# Patient Record
Sex: Male | Born: 1970 | Race: Black or African American | Hispanic: No | Marital: Married | Smoking: Current every day smoker
Health system: Southern US, Community
[De-identification: ages and names within clinical notes are randomized; demographics above are authoritative.]

## PROBLEM LIST (undated history)

## (undated) DIAGNOSIS — G1221 Amyotrophic lateral sclerosis: Secondary | ICD-10-CM

## (undated) HISTORY — PX: OTHER SURGICAL HISTORY: SHX169

---

## 2013-03-18 ENCOUNTER — Encounter (HOSPITAL_COMMUNITY): Payer: Self-pay

## 2013-03-18 ENCOUNTER — Emergency Department (HOSPITAL_COMMUNITY): Payer: Self-pay

## 2013-03-18 ENCOUNTER — Emergency Department (HOSPITAL_COMMUNITY)
Admission: EM | Admit: 2013-03-18 | Discharge: 2013-03-18 | Disposition: A | Discharge status: Home / Self Care | Payer: Self-pay | Attending: Emergency Medicine | Admitting: Emergency Medicine

## 2013-03-18 DIAGNOSIS — F17200 Nicotine dependence, unspecified, uncomplicated: Secondary | ICD-10-CM | POA: Insufficient documentation

## 2013-03-18 DIAGNOSIS — N451 Epididymitis: Secondary | ICD-10-CM

## 2013-03-18 DIAGNOSIS — N489 Disorder of penis, unspecified: Secondary | ICD-10-CM | POA: Insufficient documentation

## 2013-03-18 DIAGNOSIS — R369 Urethral discharge, unspecified: Secondary | ICD-10-CM | POA: Insufficient documentation

## 2013-03-18 DIAGNOSIS — N5089 Other specified disorders of the male genital organs: Secondary | ICD-10-CM | POA: Insufficient documentation

## 2013-03-18 DIAGNOSIS — R300 Dysuria: Secondary | ICD-10-CM | POA: Insufficient documentation

## 2013-03-18 DIAGNOSIS — N453 Epididymo-orchitis: Secondary | ICD-10-CM | POA: Insufficient documentation

## 2013-03-18 LAB — CBC WITH DIFFERENTIAL/PLATELET
Basophils Relative: 0 % (ref 0–1)
HCT: 35.1 % — ABNORMAL LOW (ref 39.0–52.0)
Hemoglobin: 12 g/dL — ABNORMAL LOW (ref 13.0–17.0)
Lymphocytes Relative: 13 % (ref 12–46)
Lymphs Abs: 2.1 10*3/uL (ref 0.7–4.0)
Monocytes Absolute: 1.3 10*3/uL — ABNORMAL HIGH (ref 0.1–1.0)
Monocytes Relative: 8 % (ref 3–12)
Neutro Abs: 12.8 10*3/uL — ABNORMAL HIGH (ref 1.7–7.7)
Neutrophils Relative %: 79 % — ABNORMAL HIGH (ref 43–77)
RBC: 3.56 MIL/uL — ABNORMAL LOW (ref 4.22–5.81)
WBC: 16.3 10*3/uL — ABNORMAL HIGH (ref 4.0–10.5)

## 2013-03-18 LAB — URINALYSIS, ROUTINE W REFLEX MICROSCOPIC
Glucose, UA: NEGATIVE mg/dL
Nitrite: NEGATIVE
Specific Gravity, Urine: >1.03 — ABNORMAL HIGH (ref 1.005–1.030)
pH: 5.5 (ref 5.0–8.0)

## 2013-03-18 LAB — LACTIC ACID, PLASMA: Lactic Acid, Venous: 1.2 mmol/L (ref 0.5–2.2)

## 2013-03-18 LAB — BASIC METABOLIC PANEL
BUN: 8 mg/dL (ref 6–23)
CO2: 29 mEq/L (ref 19–32)
Chloride: 98 mEq/L (ref 96–112)
GFR calc Af Amer: 80 mL/min — ABNORMAL LOW (ref >90)
Glucose, Bld: 120 mg/dL — ABNORMAL HIGH (ref 70–99)
Potassium: 3.9 mEq/L (ref 3.5–5.1)

## 2013-03-18 MED ORDER — DEXTROSE 5 % IV SOLN
1.0000 g | Freq: Once | INTRAVENOUS | Status: AC
  Administered 2013-03-18: 1 g via INTRAVENOUS
  Filled 2013-03-18: qty 10

## 2013-03-18 MED ORDER — AZITHROMYCIN 250 MG PO TABS
500.0000 mg | ORAL_TABLET | Freq: Once | ORAL | Status: AC
  Administered 2013-03-18: 500 mg via ORAL
  Filled 2013-03-18: qty 2

## 2013-03-18 MED ORDER — DOXYCYCLINE HYCLATE 100 MG PO CAPS: 100.0000 mg | ORAL_CAPSULE | Freq: Two times a day (BID) | ORAL | Status: DC

## 2013-03-18 MED ORDER — ACETAMINOPHEN 500 MG PO TABS
1000.0000 mg | ORAL_TABLET | Freq: Once | ORAL | Status: AC
  Administered 2013-03-18: 1000 mg via ORAL
  Filled 2013-03-18: qty 2

## 2013-03-18 NOTE — ED Provider Notes (Signed)
CSN: [id number]     Arrival date & time 03/18/13  1751 History  This chart was scribed for Larry Sou, MD by Bennett Scrape, ED Scribe. This patient was seen in room APA09/APA09 and the patient's care was started at 6:28 PM.   First MD Initiated Contact with Patient 03/18/13 1817     Chief Complaint  Patient presents with  . Abdominal Pain    Patient is a 42 y.o. male presenting with abdominal pain. The history is provided by the patient. No language interpreter was used.  Abdominal Pain This is a new problem. The current episode started more than 2 days ago. The problem occurs constantly. The problem has been gradually worsening. Associated symptoms include abdominal pain. Nothing aggravates the symptoms. Nothing relieves the symptoms. He has tried nothing for the symptoms.    HPI Comments: Larry Hunt is a 42 y.o. male who presents to the Emergency Department complaining of 3 days of suprapubic abdominal pain with associated penile pain, penile discharge and dysuria. Also complains of swelling of left testicle for approximately 3 days. No known fever.  No nausea or vomiting. No other associated symptoms. He denies having any known fevers. Last BM was earlier today and he denies having pain with BMs. He states that he has been eating and drinking normally since the onset. Pt does not have a h/o chronic medical conditions. Pt is a current everyday smoker but denies alcohol use. Last used marijuana 2 to 3 months ago. No treatment prior to coming here.Marland Kitchen   PMFH: None  History  Substance Use Topics  . Smoking status: Current Every Day Smoker  . Smokeless tobacco: Not on file  . Alcohol Use: No    Review of Systems  Constitutional: Negative.   HENT: Negative.   Respiratory: Negative.   Cardiovascular: Negative.   Gastrointestinal: Positive for abdominal pain. Negative for nausea and vomiting.  Genitourinary: Positive for dysuria, discharge, scrotal swelling and penile  pain.  Musculoskeletal: Negative.   Skin: Negative.   Neurological: Negative.   Psychiatric/Behavioral: Negative.   All other systems reviewed and are negative.    Allergies  Review of patient's allergies indicates no known allergies.  Home Medications  No current outpatient prescriptions on file.  Triage Vitals: BP 126/59  Pulse 89  Temp(Src) 102.1 F (38.9 C) (Oral)  Resp 20  Ht 6\' 1"  (1.854 m)  Wt 195 lb (88.451 kg)  BMI 25.73 kg/m2  SpO2 99%  Physical Exam  Nursing note and vitals reviewed. Constitutional: He appears well-developed and well-nourished.  HENT:  Head: Normocephalic and atraumatic.  Eyes: Conjunctivae are normal. Pupils are equal, round, and reactive to light.  Neck: Neck supple. No tracheal deviation present. No thyromegaly present.  Cardiovascular: Normal rate and regular rhythm.   No murmur heard. Pulmonary/Chest: Effort normal and breath sounds normal.  Abdominal: Soft. Bowel sounds are normal. He exhibits no distension. There is no tenderness.  Genitourinary: Penis normal.  Left scrotum is diffusely swollen,  and tender. Circumcised. No obvious urethral discharge  Musculoskeletal: Normal range of motion. He exhibits no edema and no tenderness.  Neurological: He is alert. Coordination normal.  Skin: Skin is warm and dry. No rash noted.  Psychiatric: He has a normal mood and affect.    ED Course   Procedures (including critical care time)  Medications  cefTRIAXone (ROCEPHIN) 1 g in dextrose 5 % 50 mL IVPB (1 g Intravenous New Bag/Given 03/18/13 2046)  acetaminophen (TYLENOL) tablet 1,000 mg (1,000 mg Oral  Given 03/18/13 1852)  azithromycin [facility]) tablet 500 mg (500 mg Oral Given 03/18/13 2053)    DIAGNOSTIC STUDIES: Oxygen Saturation is 99% on room air, normal by my interpretation.    COORDINATION OF CARE: 6:35 PM-Discussed treatment plan which includes medications, [place], CBC panel, BMP and UA with pt at bedside and pt agreed to plan.  8:46  PM-Pt rechecked and reports improvement with medications listed above.  Labs Reviewed  CBC WITH DIFFERENTIAL - Abnormal; Notable for the following:    WBC 16.3 (*)    RBC 3.56 (*)    Hemoglobin 12.0 (*)    HCT 35.1 (*)    Neutrophils Relative % 79 (*)    Neutro Abs 12.8 (*)    Monocytes Absolute 1.3 (*)    All other components within normal limits  BASIC METABOLIC PANEL - Abnormal; Notable for the following:    Glucose, Bld 120 (*)    GFR calc non Af Amer 69 (*)    GFR calc Af Amer 80 (*)    All other components within normal limits  URINALYSIS, ROUTINE W REFLEX MICROSCOPIC - Abnormal; Notable for the following:    Color, Urine AMBER (*)    APPearance TURBID (*)    Specific Gravity, Urine >1.030 (*)    Hgb urine dipstick MODERATE (*)    Bilirubin Urine SMALL (*)    Ketones, ur TRACE (*)    Protein, ur 30 (*)    Leukocytes, UA MODERATE (*)    All other components within normal limits  URINE MICROSCOPIC-ADD ON - Abnormal; Notable for the following:    Bacteria, UA MANY (*)    All other components within normal limits  GC/CHLAMYDIA PROBE AMP  URINE CULTURE  LACTIC ACID, PLASMA  RPR   US Scrotum  03/18/2013   *RADIOLOGY REPORT*  Clinical Data:  The patient febrile with a tender, swollen left scrotum  SCROTAL ULTRASOUND DOPPLER ULTRASOUND OF THE TESTICLES  Technique: Complete ultrasound examination of the testicles, epididymis, and other scrotal structures was performed.  Color and spectral Doppler ultrasound were also utilized to evaluate blood flow to the testicles.  Comparison:  Findings:  Right testis:  Normal in size and echogenicity with no masses.  The right testicle measures 4.4 cm x 2.6 cm x 3.5 cm.  Left testis:  Normal in size and echogenicity with no masses.  The left testicle measures 4.5 cm x 3.1 cm x 2.9 cm.  Right epididymis:  Normal in size and appearance.  Left epididymis:  Heterogeneous in echogenicity, but normal in overall size with relatively symmetric blood flow  when compared to the right epididymis.  Hydrocele:  Bilateral hydroceles, larger on the left.  Varicocele:  No varicoceles  Pulsed Doppler interrogation of both testes demonstrates low resistance flow bilaterally. There is no significant asymmetry of blood flow.  There is scrotal wall thickening that is most evident on the left.  IMPRESSION: No significant epididymal or testicular hyperemia to suggest epididymitis/orchitis.  There are bilateral hydroceles, both relatively large, but the left being the largest.  There is associated scrotal wall thickening also appeared greater on the left.  No testicular mass or torsion.   Original Report Authenticated By: Amie Portland, M.D.   No diagnosis found. On further discussion with Dr.Ormond, radiologist he reported to me verbally on the telephone that in doing images he felt the left  At the epididymitis was likely hyperemic, possibly consistent with epididymitis. He suggested putting patient epididymitis it is felt that he has a  clinically.  Patient feels much improved after treatment with antibiotics and Tylenol. He declined other pain medication Results for orders placed during the hospital encounter of 03/18/13  CBC WITH DIFFERENTIAL      Result Value Range   WBC 16.3 (*) 4.0 - 10.5 K/uL   RBC 3.56 (*) 4.22 - 5.81 MIL/uL   Hemoglobin 12.0 (*) 13.0 - 17.0 g/dL   HCT 45.4 (*) 09.8 - 11.9 %   MCV 98.6  78.0 - 100.0 fL   MCH 33.7  26.0 - 34.0 pg   MCHC 34.2  30.0 - 36.0 g/dL   RDW 14.7  82.9 - 56.2 %   Platelets 178  150 - 400 K/uL   Neutrophils Relative % 79 (*) 43 - 77 %   Neutro Abs 12.8 (*) 1.7 - 7.7 K/uL   Lymphocytes Relative 13  12 - 46 %   Lymphs Abs 2.1  0.7 - 4.0 K/uL   Monocytes Relative 8  3 - 12 %   Monocytes Absolute 1.3 (*) 0.1 - 1.0 K/uL   Eosinophils Relative 0  0 - 5 %   Eosinophils Absolute 0.1  0.0 - 0.7 K/uL   Basophils Relative 0  0 - 1 %   Basophils Absolute 0.0  0.0 - 0.1 K/uL  BASIC METABOLIC PANEL      Result Value Range    Sodium 135  135 - 145 mEq/L   Potassium 3.9  3.5 - 5.1 mEq/L   Chloride 98  96 - 112 mEq/L   CO2 29  19 - 32 mEq/L   Glucose, Bld 120 (*) 70 - 99 mg/dL   BUN 8  6 - 23 mg/dL   Creatinine, Ser 1.30  0.50 - 1.35 mg/dL   Calcium 9.4  8.4 - 86.5 mg/dL   GFR calc non Af Amer 69 (*) >90 mL/min   GFR calc Af Amer 80 (*) >90 mL/min  URINALYSIS, ROUTINE W REFLEX MICROSCOPIC      Result Value Range   Color, Urine AMBER (*) YELLOW   APPearance TURBID (*) CLEAR   Specific Gravity, Urine >1.030 (*) 1.005 - 1.030   pH 5.5  5.0 - 8.0   Glucose, UA NEGATIVE  NEGATIVE mg/dL   Hgb urine dipstick MODERATE (*) NEGATIVE   Bilirubin Urine SMALL (*) NEGATIVE   Ketones, ur TRACE (*) NEGATIVE mg/dL   Protein, ur 30 (*) NEGATIVE mg/dL   Urobilinogen, UA 1.0  0.0 - 1.0 mg/dL   Nitrite NEGATIVE  NEGATIVE   Leukocytes, UA MODERATE (*) NEGATIVE  URINE MICROSCOPIC-ADD ON      Result Value Range   WBC, UA TOO NUMEROUS TO COUNT  <3 WBC/hpf   RBC / HPF 3-6  <3 RBC/hpf   Bacteria, UA MANY (*) RARE  LACTIC ACID, PLASMA      Result Value Range   Lactic Acid, Venous 1.2  0.5 - 2.2 mmol/L   US Scrotum  03/18/2013   **ADDENDUM** CREATED: 03/18/2013 20:57:26  I have discussed this case with the emergency room physician and re- reviewed the images.  Although there is no significant asymmetry of flow to the testicles or epididymi, the heterogeneity of the left epididymal head and the larger left hydrocele would support a left epididymitis given the patient's clinical presentation.  Also, although ordered as a scrotal ultrasound, Doppler imaging of the arterial and venous reticular flow was performed as part of this exam.  **END ADDENDUM** SIGNED BY: Amie Portland, M.D.  03/18/2013   *RADIOLOGY  REPORT*  Clinical Data:  The patient febrile with a tender, swollen left scrotum  SCROTAL ULTRASOUND DOPPLER ULTRASOUND OF THE TESTICLES  Technique: Complete ultrasound examination of the testicles, epididymis, and other scrotal  structures was performed.  Color and spectral Doppler ultrasound were also utilized to evaluate blood flow to the testicles.  Comparison:  Findings:  Right testis:  Normal in size and echogenicity with no masses.  The right testicle measures 4.4 cm x 2.6 cm x 3.5 cm.  Left testis:  Normal in size and echogenicity with no masses.  The left testicle measures 4.5 cm x 3.1 cm x 2.9 cm.  Right epididymis:  Normal in size and appearance.  Left epididymis:  Heterogeneous in echogenicity, but normal in overall size with relatively symmetric blood flow when compared to the right epididymis.  Hydrocele:  Bilateral hydroceles, larger on the left.  Varicocele:  No varicoceles  Pulsed Doppler interrogation of both testes demonstrates low resistance flow bilaterally. There is no significant asymmetry of blood flow.  There is scrotal wall thickening that is most evident on the left.  IMPRESSION: No significant epididymal or testicular hyperemia to suggest epididymitis/orchitis.  There are bilateral hydroceles, both relatively large, but the left being the largest.  There is associated scrotal wall thickening also appeared greater on the left.  No testicular mass or torsion.   Original Report Authenticated By: Amie Portland, M.D.    MDM  Clinically patient has epididymal orchitis Plan prescription doxycycline. Urology referral. Tylenol for fever Diagnosis left epididymoorchitis      Larry Sou, MD 03/18/13 2203

## 2013-03-18 NOTE — ED Notes (Signed)
Pt c/o abdominal pain and penile discharge with painful urination that started about 3 days ago.

## 2013-03-18 NOTE — ED Notes (Signed)
Pt co swollen scrotum, penile discharge, fever and abdominal pain. [place] called in, ETA 45 minutes.

## 2013-03-18 NOTE — ED Notes (Signed)
Patient given discharge instruction, verbalized understand. . IV removed, by tech, band aid applied. Patient ambulatory out of the department.

## 2013-03-18 NOTE — ED Notes (Signed)
MD at bedside. 

## 2013-03-19 LAB — URINE CULTURE
Colony Count: NO GROWTH
Culture: NO GROWTH

## 2013-03-19 LAB — RPR: RPR Ser Ql: NONREACTIVE

## 2013-03-19 LAB — GC/CHLAMYDIA PROBE AMP: GC Probe RNA: POSITIVE — AB

## 2013-03-20 NOTE — ED Notes (Addendum)
+   gonorrhea + chlamydia Patient treated with Rocephin And Zithromax-[facility] faxed

## 2013-03-21 ENCOUNTER — Telehealth (HOSPITAL_COMMUNITY): Payer: Self-pay | Admitting: Emergency Medicine

## 2013-03-21 NOTE — ED Notes (Signed)
Patient has +Gonorrhea & +Chlamydia.

## 2013-03-21 NOTE — ED Notes (Signed)
+  Gonorrhea. +Chlamydia. Patient treated with Rocephin and Zithromax. [facility] faxed. 

## 2013-03-24 ENCOUNTER — Telehealth (HOSPITAL_COMMUNITY): Payer: Self-pay | Admitting: Emergency Medicine

## 2014-02-05 IMAGING — US US SCROTUM
1 series · 13 of 25 positions shown · non-contrast
Comparison: 

***ADDENDUM*** CREATED: 03/18/2013 [DATE]

I have discussed this case with the emergency room physician and re-
reviewed the images.  Although there is no significant asymmetry of
flow to the testicles or epididymi, the heterogeneity of the left
epididymal head and the larger left hydrocele would support a left
epididymitis given the patient's clinical presentation.
Also, although ordered as a scrotal ultrasound, Doppler imaging of
the arterial and venous reticular flow was performed as part of
this exam.
***END ADDENDUM*** SIGNED BY: Aliar Teka, M.D.
CLINICAL DATA: The patient febrile with a tender, swollen left
scrotum
SCROTAL ULTRASOUND
DOPPLER ULTRASOUND OF THE TESTICLES
TECHNIQUE: Complete ultrasound examination of the testicles,
epididymis, and other scrotal structures was performed.  Color and
spectral Doppler ultrasound were also utilized to evaluate blood
flow to the testicles.

[Series 1: us scrotum · 0.06mm/px · 13 of 79 slices shown]
[im 1/79]
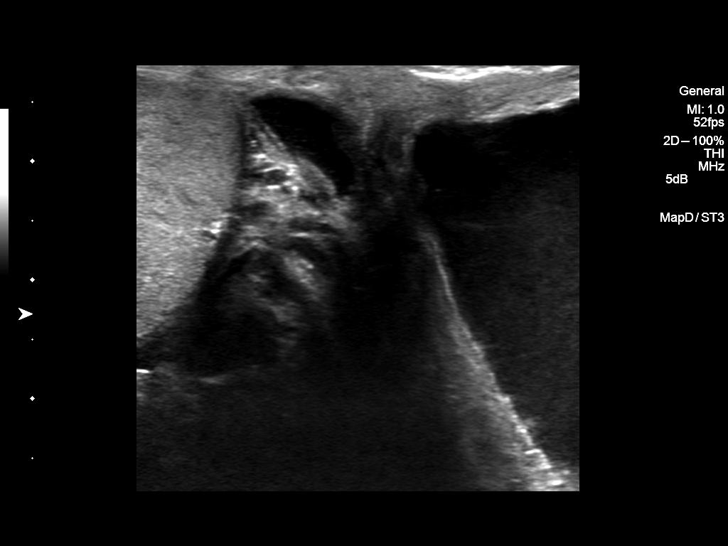
[im 7/79]
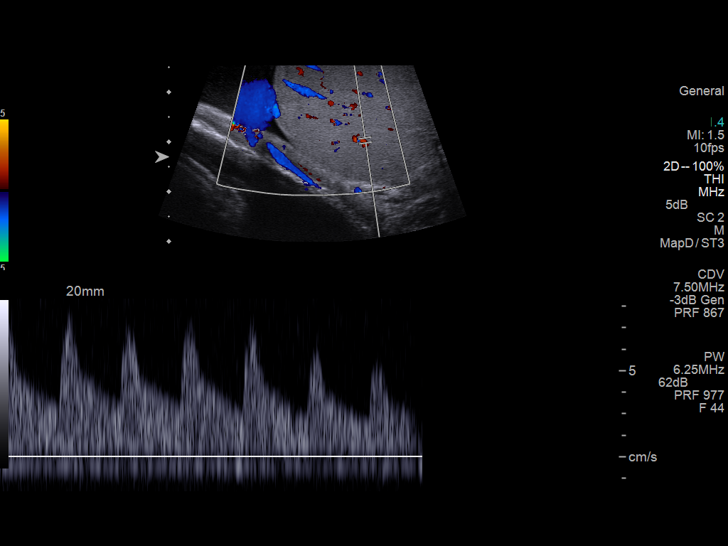
[im 14/79]
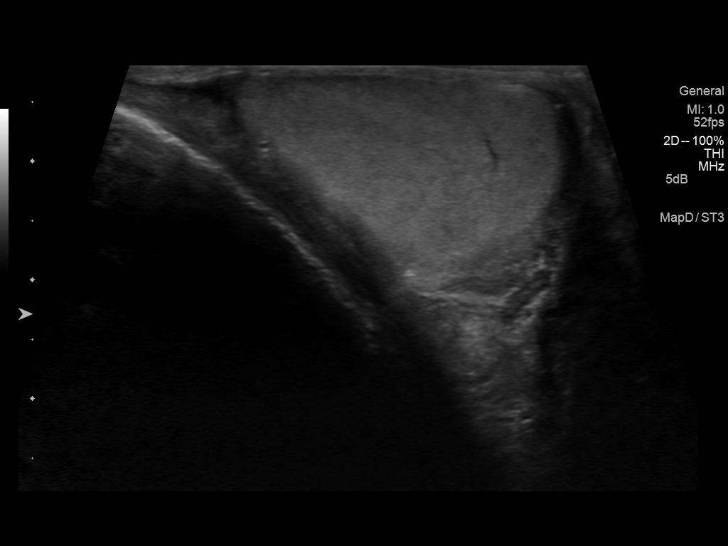
[im 20/79]
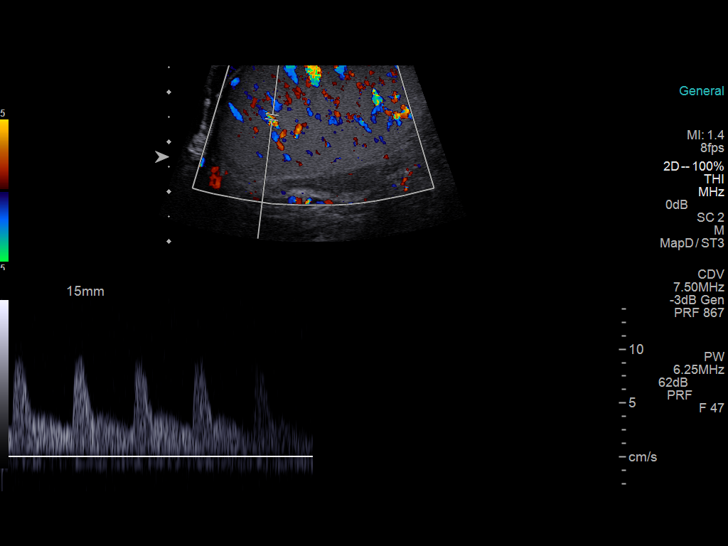
[im 27/79]
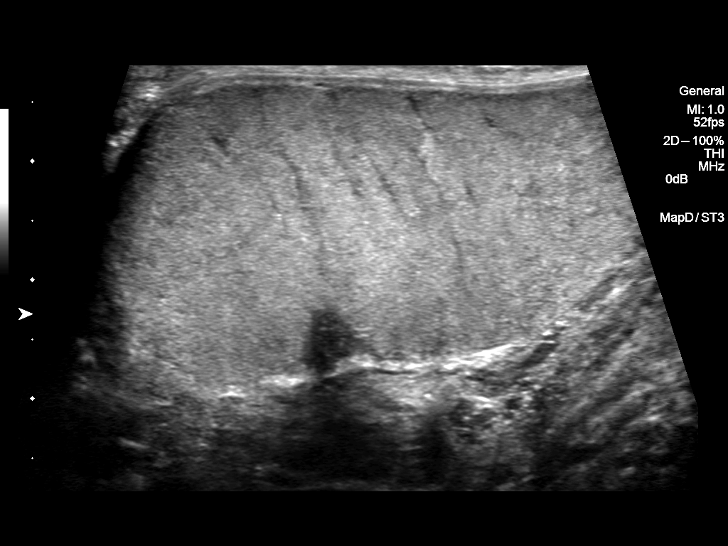
[im 33/79]
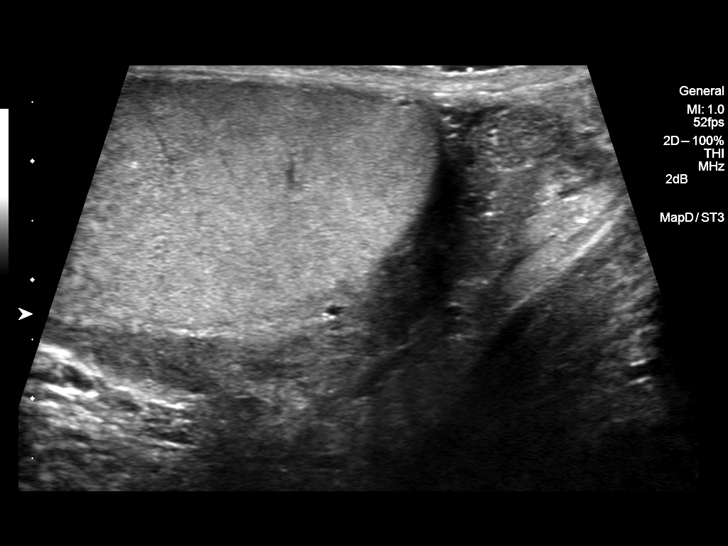
[im 40/79]
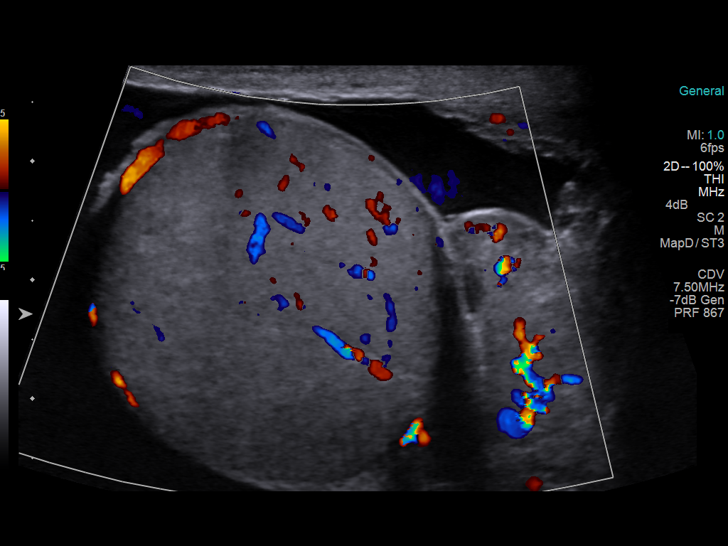
[im 46/79]
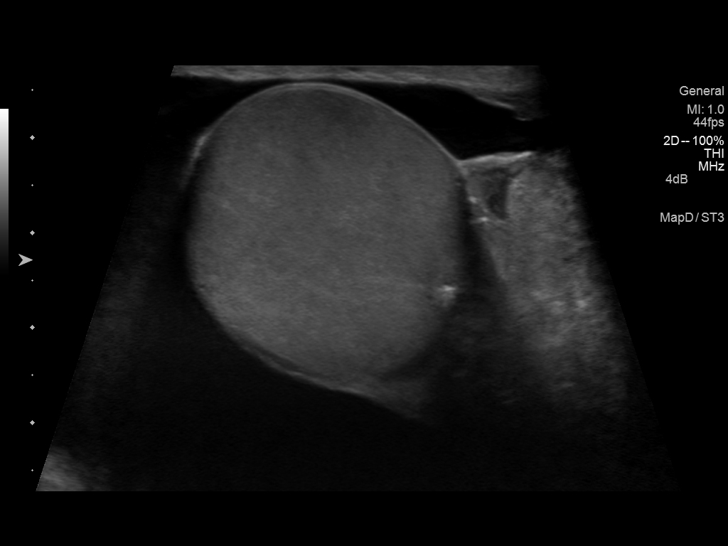
[im 53/79]
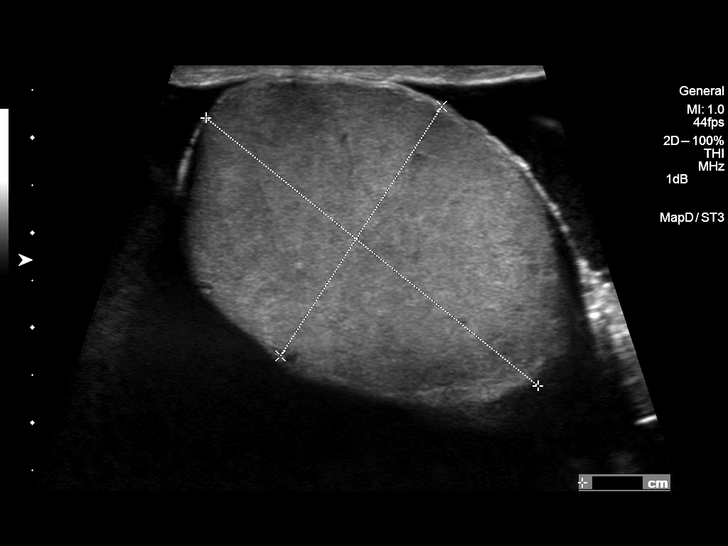
[im 59/79]
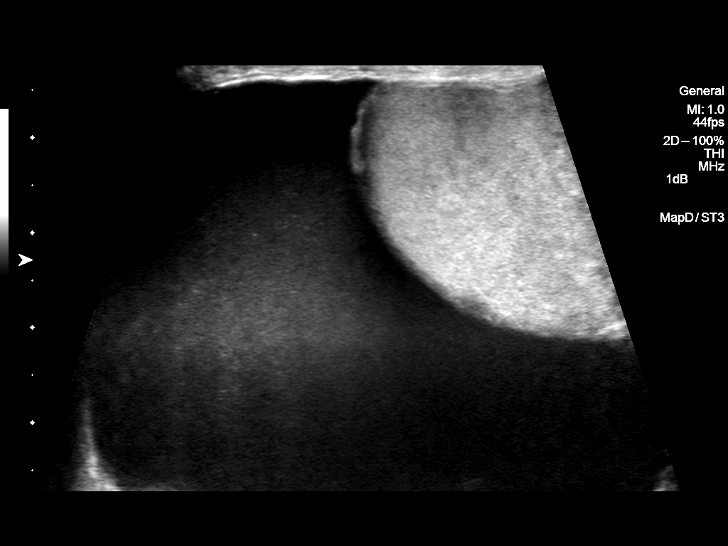
[im 66/79]
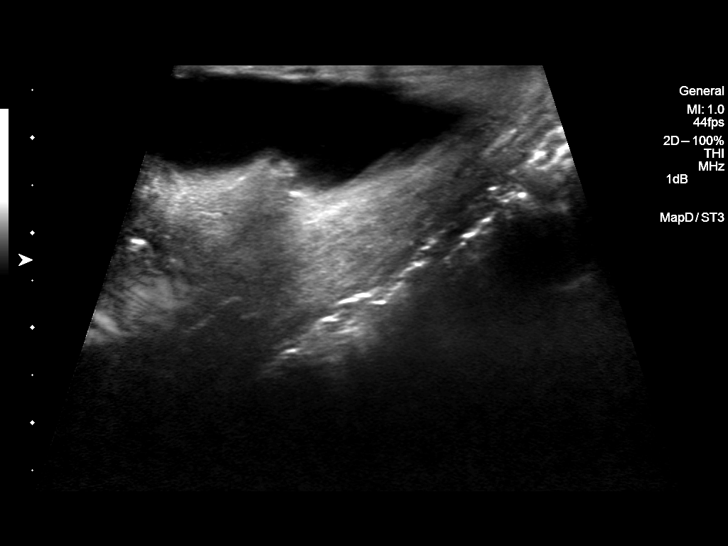
[im 72/79]
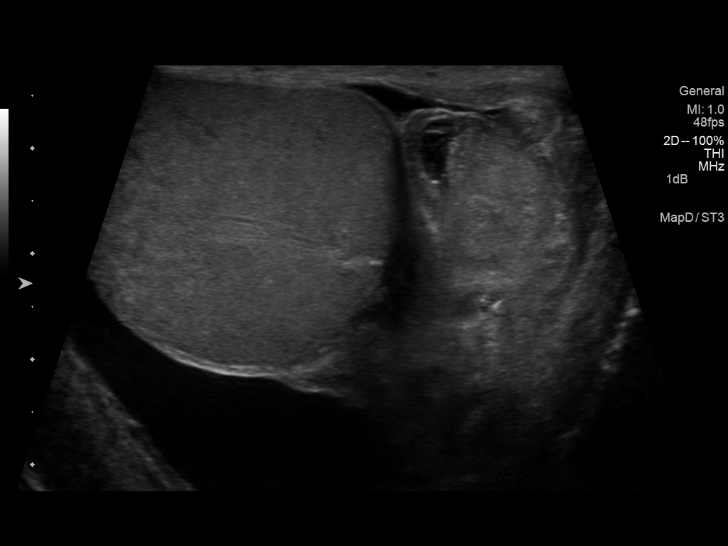
[im 79/79]
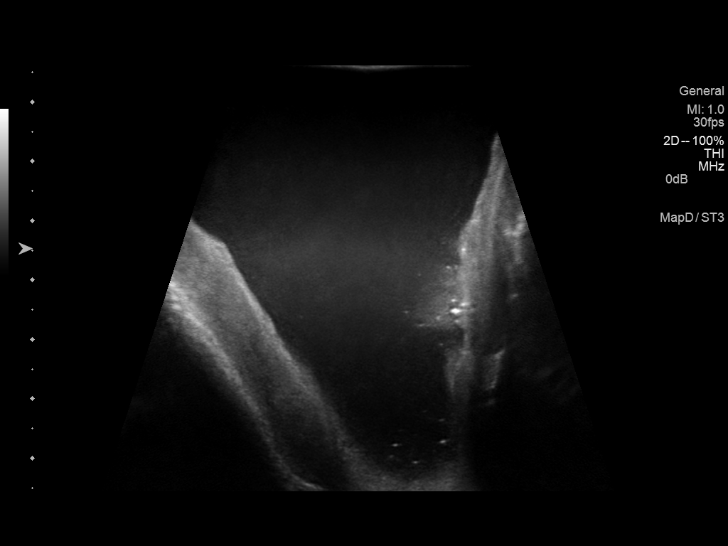

[13 of 25 positions shown; findings below may reference images not displayed]

FINDINGS: Right testis:  Normal in size and echogenicity with no masses.  The
right testicle measures 4.4 cm x 2.6 cm x 3.5 cm.

Left testis:  Normal in size and echogenicity with no masses.  The
left testicle measures 4.5 cm x 3.1 cm x 2.9 cm.

Right epididymis:  Normal in size and appearance.

Left epididymis:  Heterogeneous in echogenicity, but normal in
overall size with relatively symmetric blood flow when compared to
the right epididymis.

Hydrocele:  Bilateral hydroceles, larger on the left.

Varicocele:  No varicoceles

Pulsed Doppler interrogation of both testes demonstrates low
resistance flow bilaterally.
There is no significant asymmetry of blood flow.  There is scrotal
wall thickening that is most evident on the left.
IMPRESSION: No significant epididymal or testicular hyperemia to suggest
epididymitis/orchitis.

There are bilateral hydroceles, both relatively large, but the left
being the largest.  There is associated scrotal wall thickening
also appeared greater on the left.

No testicular mass or torsion.

## 2016-11-17 ENCOUNTER — Emergency Department (HOSPITAL_COMMUNITY)
Admission: EM | Admit: 2016-11-17 | Discharge: 2016-11-17 | Disposition: A | Discharge status: Home / Self Care | Payer: Worker's Compensation | Attending: Emergency Medicine | Admitting: Emergency Medicine

## 2016-11-17 ENCOUNTER — Encounter (HOSPITAL_COMMUNITY): Payer: Self-pay | Admitting: Emergency Medicine

## 2016-11-17 DIAGNOSIS — X12XXXA Contact with other hot fluids, initial encounter: Secondary | ICD-10-CM | POA: Diagnosis not present

## 2016-11-17 DIAGNOSIS — Z23 Encounter for immunization: Secondary | ICD-10-CM | POA: Insufficient documentation

## 2016-11-17 DIAGNOSIS — Y92511 Restaurant or cafe as the place of occurrence of the external cause: Secondary | ICD-10-CM | POA: Diagnosis not present

## 2016-11-17 DIAGNOSIS — S50821A Blister (nonthermal) of right forearm, initial encounter: Secondary | ICD-10-CM | POA: Diagnosis present

## 2016-11-17 DIAGNOSIS — Y990 Civilian activity done for income or pay: Secondary | ICD-10-CM | POA: Insufficient documentation

## 2016-11-17 DIAGNOSIS — T23161A Burn of first degree of back of right hand, initial encounter: Secondary | ICD-10-CM | POA: Diagnosis not present

## 2016-11-17 DIAGNOSIS — Y9389 Activity, other specified: Secondary | ICD-10-CM | POA: Insufficient documentation

## 2016-11-17 DIAGNOSIS — T22111A Burn of first degree of right forearm, initial encounter: Secondary | ICD-10-CM

## 2016-11-17 DIAGNOSIS — T23101A Burn of first degree of right hand, unspecified site, initial encounter: Secondary | ICD-10-CM

## 2016-11-17 MED ORDER — OXYCODONE-ACETAMINOPHEN 5-325 MG PO TABS
1.0000 | ORAL_TABLET | Freq: Once | ORAL | Status: AC
  Administered 2016-11-17: 1 via ORAL
  Filled 2016-11-17: qty 1

## 2016-11-17 MED ORDER — SILVER SULFADIAZINE 1 % EX CREA
TOPICAL_CREAM | Freq: Once | CUTANEOUS | Status: AC
  Administered 2016-11-17: 1 via TOPICAL
  Filled 2016-11-17: qty 50

## 2016-11-17 MED ORDER — SILVER SULFADIAZINE 1 % EX CREA
TOPICAL_CREAM | CUTANEOUS | 0 refills | Status: DC
Start: 2016-11-17 — End: 2018-02-10

## 2016-11-17 MED ORDER — TETANUS-DIPHTH-ACELL PERTUSSIS 5-2.5-18.5 LF-MCG/0.5 IM SUSP
0.5000 mL | Freq: Once | INTRAMUSCULAR | Status: AC
  Administered 2016-11-17: 0.5 mL via INTRAMUSCULAR
  Filled 2016-11-17: qty 0.5

## 2016-11-17 MED ORDER — IBUPROFEN 800 MG PO TABS: 800.0000 mg | ORAL_TABLET | Freq: Three times a day (TID) | ORAL | 0 refills | Status: DC

## 2016-11-17 MED ORDER — HYDROCODONE-ACETAMINOPHEN 5-325 MG PO TABS: ORAL_TABLET | ORAL | 0 refills | Status: DC

## 2016-11-17 MED ORDER — IBUPROFEN 800 MG PO TABS
800.0000 mg | ORAL_TABLET | Freq: Once | ORAL | Status: AC
  Administered 2016-11-17: 800 mg via ORAL
  Filled 2016-11-17: qty 1

## 2016-11-17 NOTE — ED Notes (Signed)
Pt soaking arm in cool water, ok per PA, pt states this is helping pain

## 2016-11-17 NOTE — Discharge Instructions (Signed)
Elevate your arm as much as possible.  Wash off and re-apply the burn cream twice a day.  Return here tomorrow for recheck

## 2016-11-17 NOTE — ED Triage Notes (Addendum)
PT states he was at work at [employer] and obtained a burn to right forearm and hand today with hot grease. Skin intact and no blistering noted.

## 2016-11-17 NOTE — ED Notes (Signed)
Loose bulky dressing applied, no complaints from pt.

## 2016-11-17 NOTE — ED Notes (Signed)
Patient given discharge instruction, verbalized understand. Patient ambulatory out of the department.  

## 2016-11-18 NOTE — ED Provider Notes (Signed)
[facility]-EMER[facility]Y DEPT Provider Note   CSN: [id number] Arrival date & time: 11/17/16  1255     History   Chief Complaint Chief Complaint  Patient presents with  . Burn    HPI Larry Hunt is a 46 y.o. male.  HPI   Larry Hunt is a 46 y.o. male who presents to the Emergency Department complaining of burn to the right dorsal hand and forearm.  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  Incident occurred less than one hour prior to arrival.  He complains of burning sensation to his hand and forearm up to the level of his elbow.  He has cleaned the area and applied cool water. He denies swelling, numbness of the extremity or difficulty moving his fingers.  Last Td is unknown  History reviewed. No pertinent past medical history.  There are no active problems to display for this patient.   History reviewed. No pertinent surgical history.     Home Medications    Prior to Admission medications   Medication Sig Start Date End Date Taking? Authorizing Provider  doxycycline (VIBRAMYCIN) 100 MG capsule Take 1 capsule (100 mg total) by mouth 2 (two) times daily. One po bid x 7 days 03/18/13   Doug Sou, MD  HYDROcodone-acetaminophen (NORCO/VICODIN) 5-325 MG tablet Take one-two tabs po q 4-6 hrs prn pain 11/17/16   Deandria Klute, PA-C  ibuprofen (ADVIL,MOTRIN) 800 MG tablet Take 1 tablet (800 mg total) by mouth 3 (three) times daily. 11/17/16   Antonie Borjon, PA-C  silver sulfADIAZINE (SILVADENE) 1 % cream Wash off and re-apply twice a day 11/17/16   Pauline Aus, PA-C    Family History History reviewed. No pertinent family history.  Social History Social History  Substance Use Topics  . Smoking status: Current Every Day Smoker    Packs/day: 1.00    Types: Cigarettes  . Smokeless tobacco: Never Used  . Alcohol use No     Allergies   Patient has no known allergies.   Review of  Systems Review of Systems  Constitutional: Negative for activity change, appetite change, chills and fever.  Respiratory: Negative for chest tightness and shortness of breath.   Musculoskeletal: Negative for arthralgias.  Skin: Negative for wound.       Burn to the right hand and forearm  Neurological: Negative for dizziness, weakness, numbness and headaches.  Hematological: Does not bruise/bleed easily.  All other systems reviewed and are negative.    Physical Exam Updated Vital Signs BP (!) 141/88 (BP Location: Left Arm)   Pulse (!) 59   Temp 99 F (37.2 C) (Oral)   Resp 18   Ht  (1.956 m)   Wt 88.5 kg   SpO2 98%   BMI 23.12 kg/m   Physical Exam  Constitutional: He is oriented to person, place, and time. He appears well-developed and well-nourished. No distress.  HENT:  Head: Normocephalic and atraumatic.  Mouth/Throat: Oropharynx is clear and moist.  Neck: Normal range of motion. Neck supple.  Cardiovascular: Normal rate, regular rhythm and intact distal pulses.   No murmur heard. Pulmonary/Chest: Effort normal and breath sounds normal. No respiratory distress.  Musculoskeletal: Normal range of motion. He exhibits no edema.       Right hand: He exhibits tenderness. He exhibits normal range of motion, no bony tenderness, normal two-point discrimination and no swelling. Normal sensation noted. Normal strength noted. He exhibits  no finger abduction, no thumb/finger opposition and no wrist extension trouble.       Hands: Pt has full ROM of the fingers of the right hand and right wrist.    Lymphadenopathy:    He has no cervical adenopathy.  Neurological: He is alert and oriented to person, place, and time. No sensory deficit. He exhibits normal muscle tone. Coordination normal.  Skin: Skin is warm. Capillary refill takes less than 2 seconds. No rash noted. No erythema.  Mild erythema of the dorsal right hand and scattered areas of erythema to the dorsal forearm.  No  edema.  Few, small blisters developing on the distal wrist, otherwise no significant skin changes.    Psychiatric: He has a normal mood and affect.  Nursing note and vitals reviewed.    ED Treatments / Results  Labs (all labs ordered are listed, but only abnormal results are displayed) Labs Reviewed - No data to display  EKG  EKG Interpretation None       Radiology No results found.  Procedures Procedures (including critical care time)  Medications Ordered in ED Medications  ibuprofen (ADVIL,MOTRIN) tablet 800 mg (800 mg Oral Given 11/17/16 1334)  oxyCODONE-acetaminophen (PERCOCET/ROXICET) 5-325 MG per tablet 1 tablet (1 tablet Oral Given 11/17/16 1334)  silver sulfADIAZINE (SILVADENE) 1 % cream (1 application Topical Given 11/17/16 1407)  Tdap (BOOSTRIX) injection 0.5 mL (0.5 mLs Intramuscular Given 11/17/16 1335)     Initial Impression / Assessment and Plan / ED Course  I have reviewed the triage vital signs and the nursing notes.  Pertinent labs & imaging results that were available during my care of the patient were reviewed by me and considered in my medical decision making (see chart for details).     Area was cleaned with saline.  Remains NV intact.  No motor or sensory deficits.  Skin intact. Pain improved after Ibuprofen and percocet.    Td updated.  Area appears c/w first degree burn although some blistering has begun to appear to the wrist.  applied silvadene and gauze dressing and will have pt return here in 24 hrs for recheck and dressing change.     Final Clinical Impressions(s) / ED Diagnoses   Final diagnoses:  Burn, hand, first degree, right, initial encounter  Superficial burn of right forearm, initial encounter    New Prescriptions Discharge Medication List as of 11/17/2016  2:17 PM    START taking these medications   Details  HYDROcodone-acetaminophen (NORCO/VICODIN) 5-325 MG tablet Take one-two tabs po q 4-6 hrs prn pain, Print    ibuprofen  (ADVIL,MOTRIN) 800 MG tablet Take 1 tablet (800 mg total) by mouth 3 (three) times daily., Starting Thu 11/17/2016, Print    silver sulfADIAZINE (SILVADENE) 1 % cream Wash off and re-apply twice a day, Print         Pauline Aus, PA-C 11/18/16 6295    Eber Hong, MD 11/19/16 [phone]

## 2018-02-10 ENCOUNTER — Emergency Department (HOSPITAL_COMMUNITY)
Admission: EM | Admit: 2018-02-10 | Discharge: 2018-02-10 | Discharge status: Against Medical Advice | Payer: Self-pay | Attending: Emergency Medicine | Admitting: Emergency Medicine

## 2018-02-10 ENCOUNTER — Encounter (HOSPITAL_COMMUNITY): Payer: Self-pay

## 2018-02-10 ENCOUNTER — Other Ambulatory Visit: Payer: Self-pay

## 2018-02-10 DIAGNOSIS — T672XXA Heat cramp, initial encounter: Secondary | ICD-10-CM | POA: Insufficient documentation

## 2018-02-10 DIAGNOSIS — F17210 Nicotine dependence, cigarettes, uncomplicated: Secondary | ICD-10-CM | POA: Insufficient documentation

## 2018-02-10 DIAGNOSIS — R42 Dizziness and giddiness: Secondary | ICD-10-CM | POA: Insufficient documentation

## 2018-02-10 DIAGNOSIS — R252 Cramp and spasm: Secondary | ICD-10-CM | POA: Insufficient documentation

## 2018-02-10 LAB — CBC WITH DIFFERENTIAL/PLATELET
BASOS ABS: 0 10*3/uL (ref 0.0–0.1)
BASOS PCT: 0 %
Eosinophils Absolute: 0.1 10*3/uL (ref 0.0–0.7)
Eosinophils Relative: 1 %
HEMATOCRIT: 36.5 % — AB (ref 39.0–52.0)
HEMOGLOBIN: 12.3 g/dL — AB (ref 13.0–17.0)
Lymphocytes Relative: 35 %
Lymphs Abs: 2 10*3/uL (ref 0.7–4.0)
MCH: 33.9 pg (ref 26.0–34.0)
MCHC: 33.7 g/dL (ref 30.0–36.0)
MCV: 100.6 fL — ABNORMAL HIGH (ref 78.0–100.0)
Monocytes Absolute: 0.4 10*3/uL (ref 0.1–1.0)
Monocytes Relative: 7 %
NEUTROS PCT: 57 %
Neutro Abs: 3.2 10*3/uL (ref 1.7–7.7)
Platelets: 136 10*3/uL — ABNORMAL LOW (ref 150–400)
RBC: 3.63 MIL/uL — ABNORMAL LOW (ref 4.22–5.81)
RDW: 12.7 % (ref 11.5–15.5)
WBC: 5.7 10*3/uL (ref 4.0–10.5)

## 2018-02-10 LAB — COMPREHENSIVE METABOLIC PANEL
ALT: 24 U/L (ref 0–44)
ANION GAP: 5 (ref 5–15)
AST: 40 U/L (ref 15–41)
Albumin: 3.8 g/dL (ref 3.5–5.0)
Alkaline Phosphatase: 37 U/L — ABNORMAL LOW (ref 38–126)
BILIRUBIN TOTAL: 1.1 mg/dL (ref 0.3–1.2)
BUN: 15 mg/dL (ref 6–20)
CO2: 26 mmol/L (ref 22–32)
Calcium: 8.8 mg/dL — ABNORMAL LOW (ref 8.9–10.3)
Chloride: 105 mmol/L (ref 98–111)
Creatinine, Ser: 1.2 mg/dL (ref 0.61–1.24)
GFR calc Af Amer: >60 mL/min (ref >60)
Glucose, Bld: 80 mg/dL (ref 70–99)
Potassium: 4.3 mmol/L (ref 3.5–5.1)
Sodium: 136 mmol/L (ref 135–145)
TOTAL PROTEIN: 6.6 g/dL (ref 6.5–8.1)

## 2018-02-10 LAB — URINALYSIS, ROUTINE W REFLEX MICROSCOPIC
Bilirubin Urine: NEGATIVE
Glucose, UA: NEGATIVE mg/dL
Hgb urine dipstick: NEGATIVE
KETONES UR: NEGATIVE mg/dL
LEUKOCYTES UA: NEGATIVE
Nitrite: NEGATIVE
PH: 6 (ref 5.0–8.0)
Protein, ur: NEGATIVE mg/dL
SPECIFIC GRAVITY, URINE: 1.031 — AB (ref 1.005–1.030)

## 2018-02-10 LAB — TROPONIN I

## 2018-02-10 LAB — CK: CK TOTAL: 874 U/L — AB (ref 49–397)

## 2018-02-10 LAB — MAGNESIUM: MAGNESIUM: 1.7 mg/dL (ref 1.7–2.4)

## 2018-02-10 LAB — PROTIME-INR
INR: 1.23
Prothrombin Time: 15.4 seconds — ABNORMAL HIGH (ref 11.4–15.2)

## 2018-02-10 LAB — I-STAT CG4 LACTIC ACID, ED: LACTIC ACID, VENOUS: 1.25 mmol/L (ref 0.5–1.9)

## 2018-02-10 MED ORDER — SODIUM CHLORIDE 0.9 % IV BOLUS: 1000.0000 mL | Freq: Once | INTRAVENOUS | Status: DC

## 2018-02-10 MED ORDER — MAGNESIUM OXIDE 400 (241.3 MG) MG PO TABS
400.0000 mg | ORAL_TABLET | Freq: Once | ORAL | Status: AC
  Administered 2018-02-10: 400 mg via ORAL
  Filled 2018-02-10: qty 1

## 2018-02-10 MED ORDER — SODIUM CHLORIDE 0.9 % IV SOLN
INTRAVENOUS | Status: DC
  Administered 2018-02-10: 125 mL/h via INTRAVENOUS

## 2018-02-10 MED ORDER — SODIUM CHLORIDE 0.9 % IV BOLUS
1000.0000 mL | Freq: Once | INTRAVENOUS | Status: AC
  Administered 2018-02-10: 1000 mL via INTRAVENOUS

## 2018-02-10 NOTE — ED Triage Notes (Signed)
Pt reports that he has been working outside for 3 days. Poor po intake and water intake. At work today, pt was outside and began to "see stars" went back inside and entire body began to cramp

## 2018-02-10 NOTE — ED Provider Notes (Signed)
[facility]ERGENCY DEPARTMENT Provider Note   CSN: [id number] Arrival date & time: 02/10/18  1242     History   Chief Complaint Chief Complaint  Patient presents with  . Muscle Pain    HPI [facility] is a 47 y.o. male.   Muscle Pain   Pt was seen at 1250. Per pt, c/o gradual onset and worsening of persistent "muscles cramping" for the past 3 days, worse today. Pt states he has been working outside in the hot weather for the past 3 days. Pt states yesterday he began to have mild muscles cramping, but went home, "cooled off" and felt better today. Pt states today he was outside working again when he began to feel lightheaded. States he went inside to cool off and that is when the muscles cramping began. Pt states "all my muscles are cramping up." States his urine has been "dark." Denies CP/palpitations, no SOB/cough, no abd pain, no N/V/D, no fevers, no rash, no syncope, no focal motor weakness.      History reviewed. No pertinent past medical history.  There are no active problems to display for this patient.   Past Surgical History:  Procedure Laterality Date  . dislocated hip          Home Medications    Prior to Admission medications   Medication Sig Start Date End Date Taking? Authorizing Provider  doxycycline (VIBRAMYCIN) 100 MG capsule Take 1 capsule (100 mg total) by mouth 2 (two) times daily. One po bid x 7 days 03/18/13   Doug SouJacubowitz, Sam, MD  HYDROcodone-acetaminophen (NORCO/VICODIN) 5-325 MG tablet Take one-two tabs po q 4-6 hrs prn pain 11/17/16   Triplett, Tammy, PA-C  ibuprofen (ADVIL,MOTRIN) 800 MG tablet Take 1 tablet (800 mg total) by mouth 3 (three) times daily. 11/17/16   Triplett, Tammy, PA-C  silver sulfADIAZINE (SILVADENE) 1 % cream Wash off and re-apply twice a day 11/17/16   Pauline Ausriplett, Tammy, PA-C    Family History No family history on file.  Social History Social History   Tobacco Use  . Smoking status: Current Every Day Smoker   Packs/day: 0.50    Types: Cigarettes  . Smokeless tobacco: Never Used  Substance Use Topics  . Alcohol use: No  . Drug use: Yes    Types: Marijuana    Comment: 2x weeks     Allergies   Patient has no known allergies.   Review of Systems Review of Systems ROS: Statement: All systems negative except as marked or noted in the HPI; Constitutional: Negative for fever and chills. ; ; Eyes: Negative for eye pain, redness and discharge. ; ; ENMT: Negative for ear pain, hoarseness, nasal congestion, sinus pressure and sore throat. ; ; Cardiovascular: Negative for chest pain, palpitations, diaphoresis, dyspnea and peripheral edema. ; ; Respiratory: Negative for cough, wheezing and stridor. ; ; Gastrointestinal: Negative for nausea, vomiting, diarrhea, abdominal pain, blood in stool, hematemesis, jaundice and rectal bleeding. ; ; Genitourinary: Negative for dysuria, flank pain and hematuria. ; ; Musculoskeletal: +muscles cramping. Negative for back pain and neck pain. Negative for swelling and trauma.; ; Skin: Negative for pruritus, rash, abrasions, blisters, bruising and skin lesion.; ; Neuro: +lightheadedness. Negative for headache and neck stiffness. Negative for altered level of consciousness, altered mental status, extremity weakness, paresthesias, involuntary movement, seizure and syncope.       Physical Exam Updated Vital Signs BP 117/76 (BP Location: Left Arm)   Pulse 77   Temp 97.8 F (36.6 C) (Oral)  Resp 17   Wt 83.9 kg (185 lb)   SpO2 98%   BMI 21.94 kg/m    Patient Vitals for the past 24 hrs:  BP Temp Temp src Pulse Resp SpO2 Weight  02/10/18 1248 117/76 97.8 F (36.6 C) Oral 77 17 98 % -  02/10/18 1245 - - - - - - 83.9 kg (185 lb)     Physical Exam 1255: Physical examination:  Nursing notes reviewed; Vital signs and O2 SAT reviewed;  Constitutional: Well developed, Well nourished, Well hydrated, In no acute distress; Head:  Normocephalic, atraumatic; Eyes: EOMI, PERRL,  No scleral icterus; ENMT: Mouth and pharynx normal, Mucous membranes moist; Neck: Supple, Full range of motion, No lymphadenopathy; Cardiovascular: Regular rate and rhythm, No gallop; Respiratory: Breath sounds clear & equal bilaterally, No wheezes.  Speaking full sentences with ease, Normal respiratory effort/excursion; Chest: Nontender, Movement normal; Abdomen: Soft, Nontender, Nondistended, Normal bowel sounds; Genitourinary: No CVA tenderness; Extremities: Peripheral pulses normal, No tenderness, No edema, No calf edema or asymmetry.; Neuro: AA&Ox3, Major CN grossly intact.  Speech clear. No gross focal motor or sensory deficits in extremities.; Skin: Color normal, Warm, Dry.   ED Treatments / Results  Labs (all labs ordered are listed, but only abnormal results are displayed)   EKG EKG Interpretation  Date/Time:  Saturday February 10 2018 12:46:32 EDT Ventricular Rate:  74 PR Interval:    QRS Duration: 85 QT Interval:  386 QTC Calculation: 429 R Axis:   92 Text Interpretation:  Sinus rhythm Probable left atrial enlargement Borderline right axis deviation ST elev, probable normal early repol pattern No old tracing to compare Confirmed by Samuel Jester [phone]) on 02/10/2018 1:13:28 PM   Radiology   Procedures Procedures (including critical care time)  Medications Ordered in ED Medications  sodium chloride 0.9 % bolus 1,000 mL (1,000 mLs Intravenous New Bag/Given 02/10/18 1253)  0.9 %  sodium chloride infusion (has no administration in time range)     Initial Impression / Assessment and Plan / ED Course  I have reviewed the triage vital signs and the nursing notes.  Pertinent labs & imaging results that were available during my care of the patient were reviewed by me and considered in my medical decision making (see chart for details).  MDM Reviewed: previous chart, nursing note and vitals Interpretation: labs and ECG    Results for orders placed or performed during the  hospital encounter of 02/10/18  Comprehensive metabolic panel  Result Value Ref Range   Sodium 136 135 - 145 mmol/L   Potassium 4.3 3.5 - 5.1 mmol/L   Chloride 105 98 - 111 mmol/L   CO2 26 22 - 32 mmol/L   Glucose, Bld 80 70 - 99 mg/dL   BUN 15 6 - 20 mg/dL   Creatinine, Ser 1.91 0.61 - 1.24 mg/dL   Calcium 8.8 (L) 8.9 - 10.3 mg/dL   Total Protein 6.6 6.5 - 8.1 g/dL   Albumin 3.8 3.5 - 5.0 g/dL   AST 40 15 - 41 U/L   ALT 24 0 - 44 U/L   Alkaline Phosphatase 37 (L) 38 - 126 U/L   Total Bilirubin 1.1 0.3 - 1.2 mg/dL   GFR calc non Af Amer >60 >60 mL/min   GFR calc Af Amer >60 >60 mL/min   Anion gap 5 5 - 15  CK  Result Value Ref Range   Total CK 874 (H) 49 - 397 U/L  CBC with Differential  Result Value Ref Range  WBC 5.7 4.0 - 10.5 K/uL   RBC 3.63 (L) 4.22 - 5.81 MIL/uL   Hemoglobin 12.3 (L) 13.0 - 17.0 g/dL   HCT 29.5 (L) 62.1 - 30.8 %   MCV 100.6 (H) 78.0 - 100.0 fL   MCH 33.9 26.0 - 34.0 pg   MCHC 33.7 30.0 - 36.0 g/dL   RDW 65.7 84.6 - 96.2 %   Platelets 136 (L) 150 - 400 K/uL   Neutrophils Relative % 57 %   Neutro Abs 3.2 1.7 - 7.7 K/uL   Lymphocytes Relative 35 %   Lymphs Abs 2.0 0.7 - 4.0 K/uL   Monocytes Relative 7 %   Monocytes Absolute 0.4 0.1 - 1.0 K/uL   Eosinophils Relative 1 %   Eosinophils Absolute 0.1 0.0 - 0.7 K/uL   Basophils Relative 0 %   Basophils Absolute 0.0 0.0 - 0.1 K/uL  Urinalysis, Routine w reflex microscopic  Result Value Ref Range   Color, Urine YELLOW YELLOW   APPearance HAZY (A) CLEAR   Specific Gravity, Urine 1.031 (H) 1.005 - 1.030   pH 6.0 5.0 - 8.0   Glucose, UA NEGATIVE NEGATIVE mg/dL   Hgb urine dipstick NEGATIVE NEGATIVE   Bilirubin Urine NEGATIVE NEGATIVE   Ketones, ur NEGATIVE NEGATIVE mg/dL   Protein, ur NEGATIVE NEGATIVE mg/dL   Nitrite NEGATIVE NEGATIVE   Leukocytes, UA NEGATIVE NEGATIVE  Protime-INR  Result Value Ref Range   Prothrombin Time 15.4 (H) 11.4 - 15.2 seconds   INR 1.23   Magnesium  Result Value Ref  Range   Magnesium 1.7 1.7 - 2.4 mg/dL  Troponin I  Result Value Ref Range   Troponin I <0.03 <0.03 ng/mL  I-Stat CG4 Lactic Acid, ED  Result Value Ref Range   Lactic Acid, Venous 1.25 0.5 - 1.9 mmol/L     1440:  CK elevated. Multiple IVF boluses ordered. Pt refuses to be admitted. Initial intent was to dose IVF in the ED then re-check CK level. Pt now also now refusing to stay in ED for this, stating his SO is "mad and throwing his belongings out of the house." ED RN and I encouraged pt to stay, continues to refuse.  Pt makes his own medical decisions.  Risks of AMA explained to pt, including, but not limited to:  Kidney failure, further muscle breakdown, stroke, heart attack, cardiac arrythmia ("irregular heart rate/beat"), "passing out," temporary and/or permanent disability, death.  Pt verb understanding and continues to refuse admission or to even stay in ED for further IVF and re-check CK level, understanding the consequences of his decision.  I encouraged pt to follow up with his PMD on Monday and return to the ED immediately if symptoms return, or for any other concerns.  Pt verb understanding, agreeable. Pt ambulated out of the ED with steady gait, easy resps, NAD, drinking fluids.    Final Clinical Impressions(s) / ED Diagnoses   Final diagnoses:  None    ED Discharge Orders    None       Samuel Jester, DO 02/13/18 2150

## 2018-02-10 NOTE — ED Notes (Signed)
Pt states he wants to leave ED now because his girlfriend is mad and throwing his belongings out of the house and he needs to get his stuff before it gets damaged or stolen. Pt was informed of risks of leaving without completion of hydration with IV fluids. Dr. Clarene DukeMcManus spoke with patient about risks of leaving and not staying to recheck labs.

## 2018-02-14 ENCOUNTER — Other Ambulatory Visit: Payer: Self-pay

## 2018-02-14 ENCOUNTER — Encounter (HOSPITAL_COMMUNITY): Payer: Self-pay | Admitting: Emergency Medicine

## 2018-02-14 ENCOUNTER — Emergency Department (HOSPITAL_COMMUNITY)
Admission: EM | Admit: 2018-02-14 | Discharge: 2018-02-14 | Disposition: A | Discharge status: Home / Self Care | Payer: Self-pay | Attending: Emergency Medicine | Admitting: Emergency Medicine

## 2018-02-14 DIAGNOSIS — F17210 Nicotine dependence, cigarettes, uncomplicated: Secondary | ICD-10-CM | POA: Insufficient documentation

## 2018-02-14 DIAGNOSIS — Z09 Encounter for follow-up examination after completed treatment for conditions other than malignant neoplasm: Secondary | ICD-10-CM

## 2018-02-14 DIAGNOSIS — R748 Abnormal levels of other serum enzymes: Secondary | ICD-10-CM | POA: Insufficient documentation

## 2018-02-14 LAB — CK: Total CK: 291 U/L (ref 49–397)

## 2018-02-14 NOTE — ED Provider Notes (Signed)
[facility] Provider Note   CSN: [id number] Arrival date & time: 02/14/18  1131     History   Chief Complaint Chief Complaint  Patient presents with  . Follow-up    HPI [facility] is a 47 y.o. male with no significant past medical history presents today for clearance to go back to work.  He was seen on 02/10/2018 with complaint of muscle cramps for 3 days.  Work-up at that time was significant for elevated CK.  Admission was recommended but the patient refused.  He states he has been drinking "so much water that I am sick of it ".  States that his cramping has improved except he will experience it when he is stretching out his extremities.  Denies any other significant symptoms.  He denies weakness, numbness, fever, nausea, vomiting, lightheadedness, or syncope.  The history is provided by the patient.    History reviewed. No pertinent past medical history.  There are no active problems to display for this patient.   Past Surgical History:  Procedure Laterality Date  . dislocated hip          Home Medications    Prior to Admission medications   Not on File    Family History History reviewed. No pertinent family history.  Social History Social History   Tobacco Use  . Smoking status: Current Every Day Smoker    Packs/day: 0.50    Types: Cigarettes  . Smokeless tobacco: Never Used  Substance Use Topics  . Alcohol use: No  . Drug use: Yes    Types: Marijuana    Comment: 2x weeks     Allergies   Shellfish allergy   Review of Systems Review of Systems  Constitutional: Negative for chills and fever.  Respiratory: Negative for shortness of breath.   Cardiovascular: Negative for chest pain.  Gastrointestinal: Negative for abdominal pain, nausea and vomiting.  Musculoskeletal: Positive for myalgias.  Neurological: Negative for syncope, weakness, light-headedness and numbness.     Physical Exam Updated Vital Signs BP  129/79 (BP Location: Right Arm)   Pulse 67   Temp 98.1 F (36.7 C) (Oral)   Resp 14   Ht 6\' 4"  (1.93 m)   Wt 86.2 kg (190 lb)   SpO2 100%   BMI 23.13 kg/m   Physical Exam  Constitutional: He is oriented to person, place, and time. He appears well-developed and well-nourished. No distress.  HENT:  Head: Normocephalic and atraumatic.  Eyes: Conjunctivae are normal. Right eye exhibits no discharge. Left eye exhibits no discharge.  Neck: No JVD present. No tracheal deviation present.  Cardiovascular: Normal rate, regular rhythm, normal heart sounds and intact distal pulses.  Pulmonary/Chest: Effort normal and breath sounds normal.  Abdominal: Soft. Bowel sounds are normal. He exhibits no distension. There is no tenderness. There is no guarding.  Musculoskeletal: Normal range of motion. He exhibits no edema or tenderness.  5/5 strength of BUE and BLE major muscle groups.  No tenderness to palpation of the extremities.  Moves extremities spontaneously with good strength.  Neurological: He is alert and oriented to person, place, and time. No sensory deficit. He exhibits normal muscle tone.  Skin: Skin is warm and dry. No erythema.  Psychiatric: He has a normal mood and affect. His behavior is normal.  Nursing note and vitals reviewed.    ED Treatments / Results  Labs (all labs ordered are listed, but only abnormal results are displayed) Labs Reviewed  CK  EKG None  Radiology No results found.  Procedures Procedures (including critical care time)  Medications Ordered in ED Medications - No data to display   Initial Impression / Assessment and Plan / ED Course  I have reviewed the triage vital signs and the nursing notes.  Pertinent labs & imaging results that were available during my care of the patient were reviewed by me and considered in my medical decision making (see chart for details).    Patient presents requesting work note clearing him to return to work.  He  was seen and evaluated with elevated CK suggesting mild rhabdomyolysis/dehydration.  CK levels today are within normal limits.  He denies any worsening symptoms.  He is nontoxic in appearance.  No focal neurologic deficits.  Ambulatory without difficulty.  Tolerating p.o. food and fluids in the ED without difficulty.  Stable to return to work.  Encourage the patient to continue drinking water throughout the day to stay hydrated.  Recommend follow-up with PCP if symptoms return.  Discussed strict ED return precautions.  Patient and patient's significant other verbalized understanding of and agreement with plan and patient is stable for discharge home at this time.  Final Clinical Impressions(s) / ED Diagnoses   Final diagnoses:  Follow up    ED Discharge Orders    None       Bennye AlmFawze, Marybella Ethier A, PA-C 02/14/18 1415    Jacalyn LefevreHaviland, Julie, MD 02/14/18 1615

## 2018-02-14 NOTE — Discharge Instructions (Addendum)
Your repeat CK levels were normal.  Continue to drink water daily.  I recommend at least eight 8 ounce glasses of water a day.  Return to the emergency department if any concerning signs or symptoms develop.

## 2018-02-14 NOTE — ED Notes (Signed)
Pt states he needs a work note to clear him to go back to work. Pt states he was seen here for dehydration and was told he need to be reevaluated by physician before going back to work.

## 2018-02-14 NOTE — ED Triage Notes (Signed)
Pt is here for a follow up regarding visit from 02/10/2018 for dehydration. States he has no sx but must be seen for re-eval to go back to work.

## 2019-09-25 ENCOUNTER — Emergency Department (HOSPITAL_COMMUNITY): Payer: Self-pay

## 2019-09-25 ENCOUNTER — Other Ambulatory Visit: Payer: Self-pay

## 2019-09-25 ENCOUNTER — Emergency Department (HOSPITAL_COMMUNITY)
Admission: EM | Admit: 2019-09-25 | Discharge: 2019-09-25 | Disposition: A | Discharge status: Home / Self Care | Payer: Self-pay | Attending: Emergency Medicine | Admitting: Emergency Medicine

## 2019-09-25 ENCOUNTER — Encounter (HOSPITAL_COMMUNITY): Payer: Self-pay | Admitting: *Deleted

## 2019-09-25 DIAGNOSIS — Y9389 Activity, other specified: Secondary | ICD-10-CM | POA: Insufficient documentation

## 2019-09-25 DIAGNOSIS — Y999 Unspecified external cause status: Secondary | ICD-10-CM | POA: Insufficient documentation

## 2019-09-25 DIAGNOSIS — F17210 Nicotine dependence, cigarettes, uncomplicated: Secondary | ICD-10-CM | POA: Insufficient documentation

## 2019-09-25 DIAGNOSIS — X509XXA Other and unspecified overexertion or strenuous movements or postures, initial encounter: Secondary | ICD-10-CM | POA: Insufficient documentation

## 2019-09-25 DIAGNOSIS — S39012A Strain of muscle, fascia and tendon of lower back, initial encounter: Secondary | ICD-10-CM | POA: Insufficient documentation

## 2019-09-25 DIAGNOSIS — Y929 Unspecified place or not applicable: Secondary | ICD-10-CM | POA: Insufficient documentation

## 2019-09-25 MED ORDER — CYCLOBENZAPRINE HCL 10 MG PO TABS: 10.0000 mg | ORAL_TABLET | Freq: Two times a day (BID) | ORAL | 0 refills | Status: AC | PRN

## 2019-09-25 MED ORDER — NAPROXEN 500 MG PO TABS: 500.0000 mg | ORAL_TABLET | Freq: Two times a day (BID) | ORAL | 0 refills | Status: AC

## 2019-09-25 MED ORDER — KETOROLAC TROMETHAMINE 60 MG/2ML IM SOLN
30.0000 mg | Freq: Once | INTRAMUSCULAR | Status: AC
  Administered 2019-09-25: 30 mg via INTRAMUSCULAR
  Filled 2019-09-25: qty 2

## 2019-09-25 MED ORDER — LIDOCAINE 5 % EX PTCH
2.0000 | MEDICATED_PATCH | CUTANEOUS | Status: DC
  Administered 2019-09-25: 14:00:00 2 via TRANSDERMAL
  Filled 2019-09-25: qty 2

## 2019-09-25 NOTE — ED Triage Notes (Signed)
Pt reports lower back pain x 2 days, worsened today. Denies injury. Denies urinary symptoms.

## 2019-09-25 NOTE — ED Provider Notes (Signed)
[facility] EMERGENCY DEPARTMENT Provider Note   CSN: [id number] Arrival date & time: 09/25/19  1152     History Chief Complaint  Patient presents with  . Back Pain    [facility] is a 49 y.o. male.  49yo male with no significant past medical history presents with low back pain x 2-3 days. Patient states he stood up and stretched his back a few days ago and felt a sudden sharp pain in his left lower back that radiates down his left leg. Pain is constant, worse with movement. Denies abdominal pain, changes in bowel or bladder habits, groin numbness, fever, falls or prior back problems. Patient has not taken anything for his pain. No other complaints or concerns.         History reviewed. No pertinent past medical history.  There are no problems to display for this patient.   Past Surgical History:  Procedure Laterality Date  . dislocated hip         No family history on file.  Social History   Tobacco Use  . Smoking status: Current Every Day Smoker    Packs/day: 0.50    Types: Cigarettes  . Smokeless tobacco: Never Used  Substance Use Topics  . Alcohol use: No  . Drug use: Yes    Types: Marijuana    Comment: 2x weeks    Home Medications Prior to Admission medications   Medication Sig Start Date End Date Taking? Authorizing Provider  cyclobenzaprine (FLEXERIL) 10 MG tablet Take 1 tablet (10 mg total) by mouth 2 (two) times daily as needed for muscle spasms. 09/25/19   Jeannie Fend, PA-C  naproxen (NAPROSYN) 500 MG tablet Take 1 tablet (500 mg total) by mouth 2 (two) times daily. 09/25/19   Jeannie Fend, PA-C    Allergies    Shellfish allergy  Review of Systems   Review of Systems  Constitutional: Negative for fever.  Gastrointestinal: Negative for abdominal pain, constipation, diarrhea, nausea and vomiting.  Genitourinary: Negative for difficulty urinating.  Musculoskeletal: Positive for back pain. Negative for gait problem.  Skin:  Negative for rash and wound.  Allergic/Immunologic: Negative for immunocompromised state.  Neurological: Negative for weakness and numbness.  Psychiatric/Behavioral: Negative for confusion.  All other systems reviewed and are negative.   Physical Exam Updated Vital Signs BP 134/85 (BP Location: Right Arm)   Pulse 62   Temp 98.6 F (37 C) (Oral)   Resp 16   Ht 6\' 4"  (1.93 m)   Wt 83.9 kg   SpO2 100%   BMI 22.52 kg/m   Physical Exam Vitals and nursing note reviewed.  Constitutional:      General: He is not in acute distress.    Appearance: He is well-developed. He is not diaphoretic.  HENT:     Head: Normocephalic and atraumatic.  Cardiovascular:     Pulses: Normal pulses.  Pulmonary:     Effort: Pulmonary effort is normal.  Abdominal:     Palpations: Abdomen is soft.     Tenderness: There is no abdominal tenderness.  Musculoskeletal:        General: Tenderness present. No swelling, deformity or signs of injury.     Lumbar back: Tenderness present. No bony tenderness.       Back:     Right lower leg: No edema.     Left lower leg: No edema.  Skin:    General: Skin is warm and dry.     Findings: No erythema or  rash.  Neurological:     Mental Status: He is alert and oriented to person, place, and time.     Sensory: Sensation is intact.     Deep Tendon Reflexes: Babinski sign absent on the right side. Babinski sign absent on the left side.     Reflex Scores:      Patellar reflexes are 1+ on the right side and 1+ on the left side.      Achilles reflexes are 1+ on the right side and 1+ on the left side. Psychiatric:        Behavior: Behavior normal.     ED Results / Procedures / Treatments   Labs (all labs ordered are listed, but only abnormal results are displayed) Labs Reviewed - No data to display  EKG None  Radiology DG Lumbar Spine Complete  Result Date: 09/25/2019 CLINICAL DATA:  Left lower back pain 2 days. EXAM: LUMBAR SPINE - COMPLETE 4+ VIEW  COMPARISON:  None. FINDINGS: Subtle curvature of the lumbar spine convex right. Vertebral body heights are normal. There is moderate spondylosis of the lower lumbar spine to include facet arthropathy. Mild disc space narrowing from the L3-4 level to the L5-S1 level. No evidence of compression fracture or spondylolisthesis. IMPRESSION: No acute findings. 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 Electronically Signed   By: Marin Olp M.D.   On: 09/25/2019 13:55    Procedures Procedures (including critical care time)  Medications Ordered in ED Medications  lidocaine (LIDODERM) 5 % 2 patch (2 patches Transdermal Patch Applied 09/25/19 1416)  ketorolac (TORADOL) injection 30 mg (30 mg Intramuscular Given 09/25/19 1416)    ED Course  I have reviewed the triage vital signs and the nursing notes.  Pertinent labs & imaging results that were available during my care of the patient were reviewed by me and considered in my medical decision making (see chart for details).  Clinical Course as of Sep 24 1432  Wed Sep 25, 3359  [id number] 49 year old male with complaint of low back pain after stretching a few days ago and feeling a sudden sharp pain in his left lower back radiating down his left leg.  No red flag symptoms, reflexes, sensation, strength intact in the lower extremities.  Pain is reproduced with palpation of left and right lower back.  No midline or bony tenderness.   [LM]  1610 X-ray lumbar spine shows no acute findings, moderate spondylosis of the lower lumbar spine with disc disease from L3-4 level to the L5-S1 level.  Patient will be given a Lidoderm patch while in the emergency room.  Discharged with prescriptions for Flexeril and naproxen, recommend warm compresses and gentle stretching and follow-up with PCP.   [LM]    Clinical Course User Index [LM] Roque Lias   MDM Rules/Calculators/A&P                       Final Clinical Impression(s) / ED Diagnoses Final diagnoses:  Strain of lumbar region, initial encounter    Rx / DC Orders ED Discharge Orders         Ordered    cyclobenzaprine (FLEXERIL) 10 MG tablet  2 times daily PRN     09/25/19 1405    naproxen (NAPROSYN) 500 MG tablet  2 times daily     09/25/19 1405           Tacy Learn, PA-C 09/25/19  1434    Sabas Sous, MD 09/26/19 [phone]

## 2019-09-25 NOTE — Discharge Instructions (Addendum)
Take naproxen and Flexeril as needed as directed.  Do not take Flexeril if driving or operating machinery. Apply warm compresses to your low back for 30 minutes at a time. Follow-up with primary care provider if symptoms persist.

## 2020-08-14 IMAGING — DX DG LUMBAR SPINE COMPLETE 4+V
5 series · 5 of 5 positions shown · non-contrast
Comparison: None.

CLINICAL DATA: Left lower back pain 2 days.

EXAM:
LUMBAR SPINE - COMPLETE 4+ VIEW

[l-spine ap]
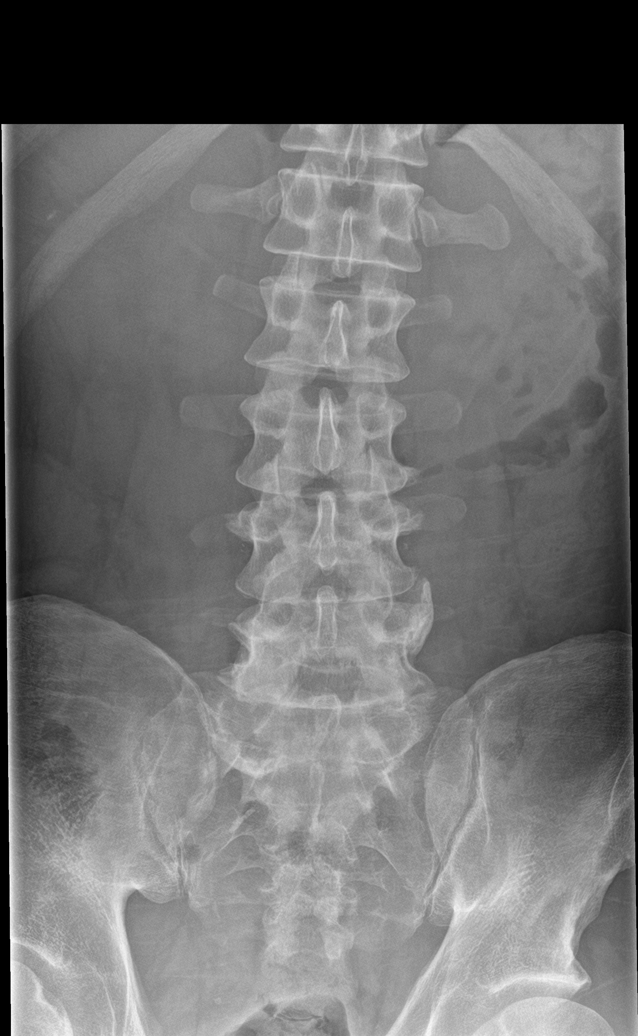

[l-spine obl (1 of 2)]
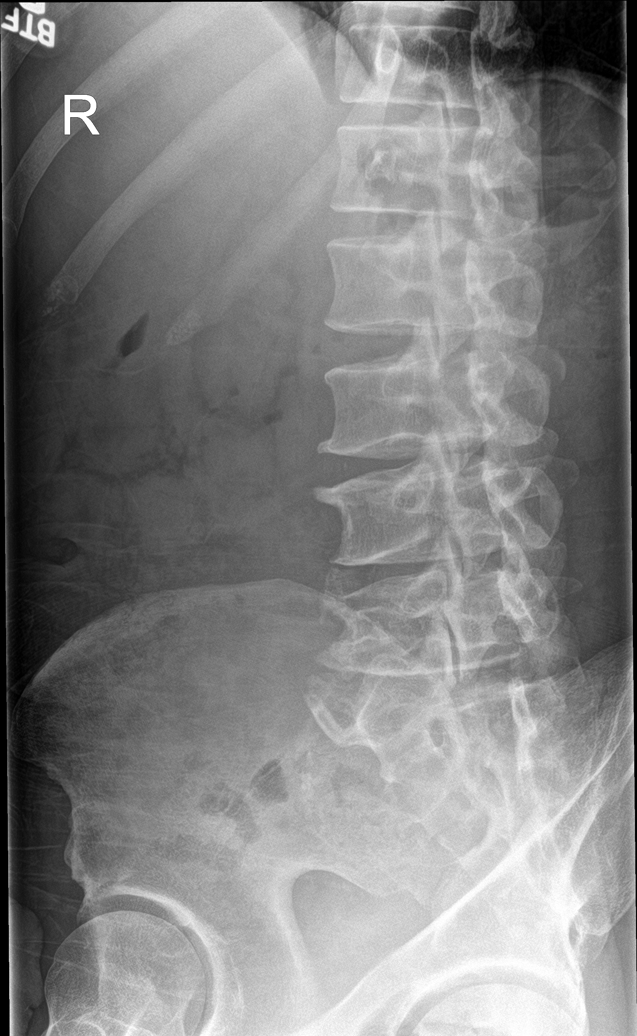

[l-spine obl (2 of 2)]
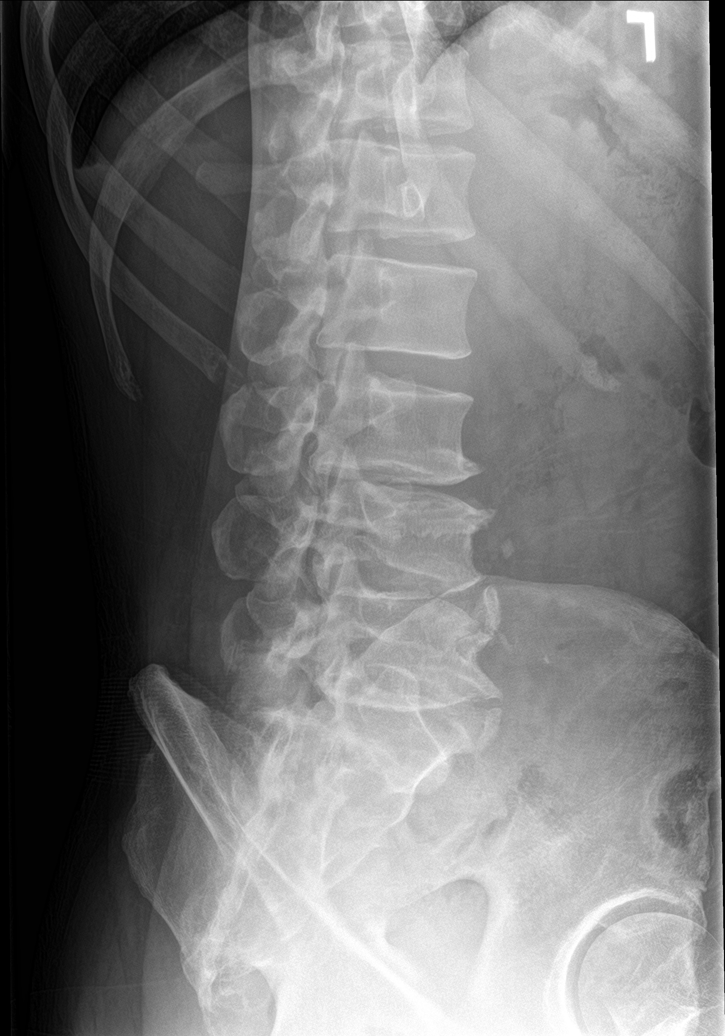

[l-spine lat]
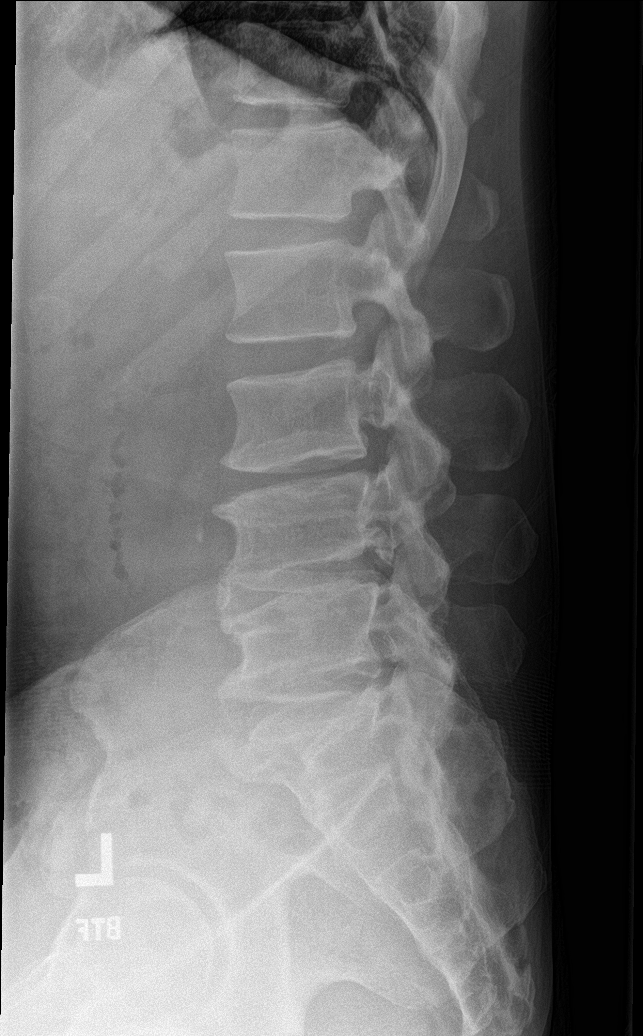

[l-spine spot]
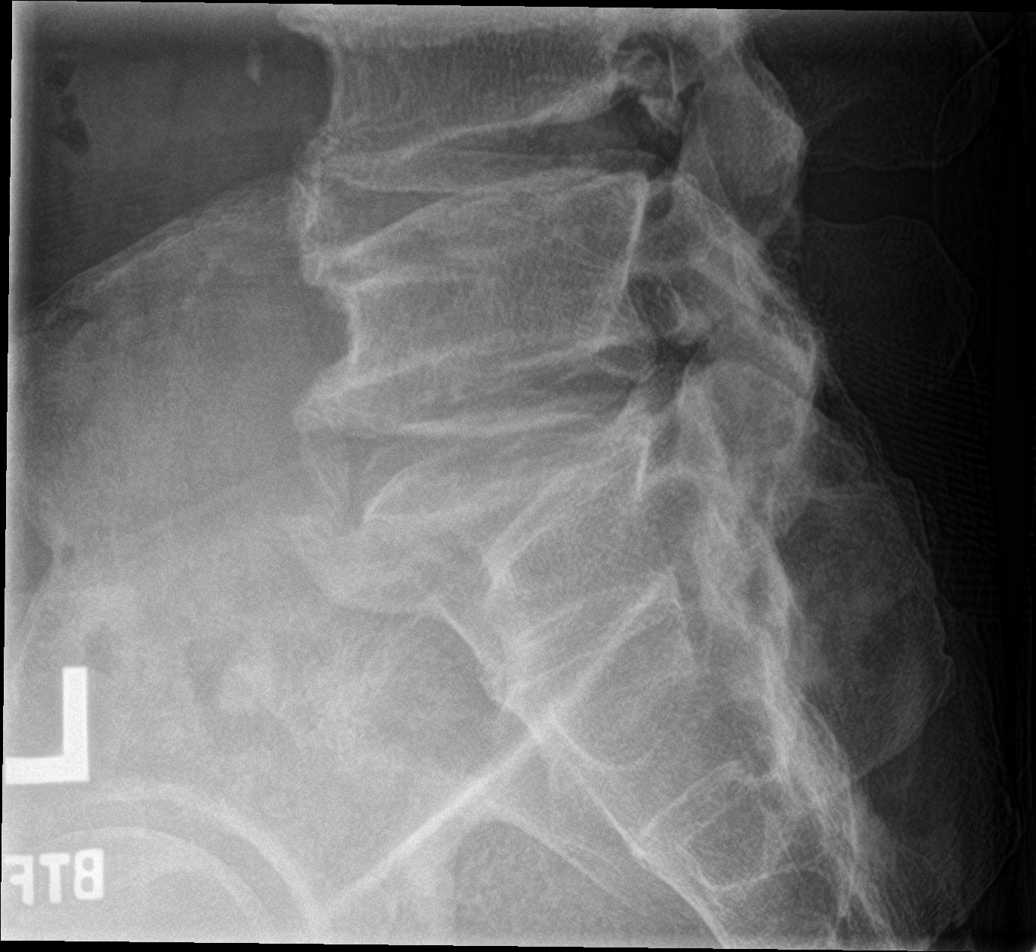

[5 of 5 positions shown; findings below may reference images not displayed]

FINDINGS: Subtle curvature of the lumbar spine convex right. Vertebral body
heights are normal. There is moderate spondylosis of the lower
lumbar spine to include facet arthropathy. Mild disc space narrowing
from the L3-4 level to the L5-S1 level. No evidence of compression
fracture or spondylolisthesis.
IMPRESSION: No acute findings.

Moderate spondylosis of the lower lumbar spine with disc disease
from the L3-4 level to the L5-S1 level.

## 2021-05-27 ENCOUNTER — Other Ambulatory Visit: Payer: Self-pay

## 2021-05-27 ENCOUNTER — Emergency Department (HOSPITAL_COMMUNITY): Payer: Medicare Other

## 2021-05-27 ENCOUNTER — Emergency Department (HOSPITAL_COMMUNITY)
Admission: EM | Admit: 2021-05-27 | Discharge: 2021-05-27 | Disposition: A | Discharge status: Home / Self Care | Payer: Medicare Other | Attending: Emergency Medicine | Admitting: Emergency Medicine

## 2021-05-27 ENCOUNTER — Encounter (HOSPITAL_COMMUNITY): Payer: Self-pay

## 2021-05-27 DIAGNOSIS — N5082 Scrotal pain: Secondary | ICD-10-CM | POA: Diagnosis present

## 2021-05-27 DIAGNOSIS — F17210 Nicotine dependence, cigarettes, uncomplicated: Secondary | ICD-10-CM | POA: Insufficient documentation

## 2021-05-27 DIAGNOSIS — N432 Other hydrocele: Secondary | ICD-10-CM | POA: Diagnosis not present

## 2021-05-27 HISTORY — DX: Amyotrophic lateral sclerosis: G12.21

## 2021-05-27 LAB — URINALYSIS, ROUTINE W REFLEX MICROSCOPIC
Bacteria, UA: NONE SEEN
Bilirubin Urine: NEGATIVE
Glucose, UA: NEGATIVE mg/dL
Hgb urine dipstick: NEGATIVE
Ketones, ur: NEGATIVE mg/dL
Nitrite: NEGATIVE
Protein, ur: NEGATIVE mg/dL
Specific Gravity, Urine: 1.006 (ref 1.005–1.030)
pH: 8 (ref 5.0–8.0)

## 2021-05-27 LAB — BASIC METABOLIC PANEL
Anion gap: 8 (ref 5–15)
BUN: 9 mg/dL (ref 6–20)
CO2: 26 mmol/L (ref 22–32)
Calcium: 9.3 mg/dL (ref 8.9–10.3)
Chloride: 102 mmol/L (ref 98–111)
Creatinine, Ser: 1.07 mg/dL (ref 0.61–1.24)
GFR, Estimated: >60 mL/min (ref >60)
Glucose, Bld: 100 mg/dL — ABNORMAL HIGH (ref 70–99)
Potassium: 4 mmol/L (ref 3.5–5.1)
Sodium: 136 mmol/L (ref 135–145)

## 2021-05-27 LAB — CBC WITH DIFFERENTIAL/PLATELET
Abs Immature Granulocytes: 0.02 10*3/uL (ref 0.00–0.07)
Basophils Absolute: 0 10*3/uL (ref 0.0–0.1)
Basophils Relative: 1 %
Eosinophils Absolute: 0.1 10*3/uL (ref 0.0–0.5)
Eosinophils Relative: 1 %
HCT: 43.3 % (ref 39.0–52.0)
Hemoglobin: 14.3 g/dL (ref 13.0–17.0)
Immature Granulocytes: 0 %
Lymphocytes Relative: 35 %
Lymphs Abs: 2.4 10*3/uL (ref 0.7–4.0)
MCH: 34.3 pg — ABNORMAL HIGH (ref 26.0–34.0)
MCHC: 33 g/dL (ref 30.0–36.0)
MCV: 103.8 fL — ABNORMAL HIGH (ref 80.0–100.0)
Monocytes Absolute: 0.5 10*3/uL (ref 0.1–1.0)
Monocytes Relative: 8 %
Neutro Abs: 3.9 10*3/uL (ref 1.7–7.7)
Neutrophils Relative %: 55 %
Platelets: 163 10*3/uL (ref 150–400)
RBC: 4.17 MIL/uL — ABNORMAL LOW (ref 4.22–5.81)
RDW: 12.4 % (ref 11.5–15.5)
WBC: 7 10*3/uL (ref 4.0–10.5)
nRBC: 0 % (ref 0.0–0.2)

## 2021-05-27 NOTE — ED Triage Notes (Signed)
Pt presents to ED with complaints of scrotal swelling x couple of days and pain. Pt states he also wants to be tested for STDs

## 2021-05-27 NOTE — Discharge Instructions (Addendum)
Lab work and imaging were reassuring, support your scrotum wear tight fitting undergarments help support your testicles.  Recommend over-the-counter pain medications as needed.  Please follow-up with urology for further evaluation please call this number above to schedule a follow-up appointment.  Come back to the emergency department if you develop chest pain, shortness of breath, severe abdominal pain, uncontrolled nausea, vomiting, diarrhea.

## 2021-05-27 NOTE — ED Provider Notes (Signed)
[facility] EMERGENCY DEPARTMENT Provider Note   CSN: [id number] Arrival date & time: 05/27/21  6440     History Chief Complaint  Patient presents with   Groin Swelling    Larry Hunt is a 50 y.o. male.  HPI  Patient with significant medical history of ALS presents to the emergency department chief complaint of scrotal pain and scrotal swelling.  Patient states she has been having scrotal pain for last couple weeks, states that its been relatively manageable but 2 days ago the pain has gotten much worse and now he has some scrotal swelling.  He states the pain got worse while he was just sitting on the couch, he denies any lifting or straining at that time.  He states that his scrotum is large and he is having some stomach pain, he states the pain is in his lower abdomen abdomen, does not radiate, he will have occasional nausea without vomiting, no constipation diarrhea, difficult bowel movements.  He states that he has noticed some penile discharge but denies dysuria, hematuria, flank pain, he denies fevers, chills, states he is in a monogamous relationship, was tested 6 months ago for STDs which were all negative.  He denies rectal pain or rectal discharge, states he is never had anything like this in the past, he denies  recent trauma to the area.  He states he went to his primary care doctor yesterday at [facility] and was instructed to come here for further evaluation.  He denies alleviating or aggravating factors at this time.  Past Medical History:  Diagnosis Date   ALS (amyotrophic lateral sclerosis) (HCC)     There are no problems to display for this patient.   Past Surgical History:  Procedure Laterality Date   dislocated hip         No family history on file.  Social History   Tobacco Use   Smoking status: Every Day    Packs/day: 0.50    Types: Cigarettes   Smokeless tobacco: Never  Substance Use Topics   Alcohol use: No   Drug use: Yes    Types:  Marijuana    Comment: 2x weeks    Home Medications Prior to Admission medications   Medication Sig Start Date End Date Taking? Authorizing Provider  cyclobenzaprine (FLEXERIL) 10 MG tablet Take 1 tablet (10 mg total) by mouth 2 (two) times daily as needed for muscle spasms. 09/25/19   Jeannie Fend, PA-C  naproxen (NAPROSYN) 500 MG tablet Take 1 tablet (500 mg total) by mouth 2 (two) times daily. 09/25/19   Jeannie Fend, PA-C    Allergies    Shellfish allergy  Review of Systems   Review of Systems  Constitutional:  Negative for chills and fever.  HENT:  Negative for congestion.   Respiratory:  Negative for shortness of breath.   Cardiovascular:  Negative for chest pain.  Gastrointestinal:  Positive for abdominal pain and nausea. Negative for diarrhea and vomiting.  Genitourinary:  Positive for penile discharge, scrotal swelling and testicular pain. Negative for enuresis, genital sores and penile pain.  Musculoskeletal:  Negative for back pain.  Skin:  Negative for rash.  Neurological:  Negative for dizziness.  Hematological:  Does not bruise/bleed easily.   Physical Exam Updated Vital Signs BP 121/82 (BP Location: Right Arm)   Pulse 72   Temp 98.1 F (36.7 C) (Oral)   Resp 20   Ht 6\' 4"  (1.93 m)   Wt 85.7 kg   SpO2 98%  BMI 23.01 kg/m   Physical Exam Vitals and nursing note reviewed. Exam conducted with a chaperone present.  Constitutional:      General: He is not in acute distress.    Appearance: He is not ill-appearing.  HENT:     Head: Normocephalic and atraumatic.     Nose: No congestion.  Eyes:     Conjunctiva/sclera: Conjunctivae normal.  Cardiovascular:     Rate and Rhythm: Normal rate and regular rhythm.     Pulses: Normal pulses.     Heart sounds: No murmur heard.   No friction rub. No gallop.  Pulmonary:     Effort: No respiratory distress.     Breath sounds: No wheezing, rhonchi or rales.  Abdominal:     Palpations: Abdomen is soft.      Tenderness: There is no abdominal tenderness. There is no right CVA tenderness or left CVA tenderness.     Comments: Abdomen nondistended, normal bowel sounds, dull to percussion, nontender to palpation no guarding, rebound tenderness, peritoneal sign, no mass present during my exam.  Genitourinary:    Penis: Normal.      Comments: With chaperone present genital exam was performed, there is noted scrotal edema, scrotum roughly the size of a mango, appears to be symmetrical bilaterally, no other gross abnormalities present of the penile shaft or surrounding area.  Able to palpate for patient's right testicle, no noted mass present, unable to palpate left testicle due to swelling, difficult to access for inguinal hernia but on my limited exam cannot feel any gross abnormalities.  Scrotum is nontender to palpation, no noted penile discharge present. Musculoskeletal:     Right lower leg: No edema.     Left lower leg: No edema.  Skin:    General: Skin is warm and dry.  Neurological:     Mental Status: He is alert.  Psychiatric:        Mood and Affect: Mood normal.    ED Results / Procedures / Treatments   Labs (all labs ordered are listed, but only abnormal results are displayed) Labs Reviewed  BASIC METABOLIC PANEL - Abnormal; Notable for the following components:      Result Value   Glucose, Bld 100 (*)    All other components within normal limits  CBC WITH DIFFERENTIAL/PLATELET - Abnormal; Notable for the following components:   RBC 4.17 (*)    MCV 103.8 (*)    MCH 34.3 (*)    All other components within normal limits  URINALYSIS, ROUTINE W REFLEX MICROSCOPIC - Abnormal; Notable for the following components:   Leukocytes,Ua MODERATE (*)    All other components within normal limits  URINE CULTURE  GC/CHLAMYDIA PROBE AMP ([place]) NOT AT [facility]    EKG None  Radiology US SCROTUM W/DOPPLER  Result Date: 05/27/2021 CLINICAL DATA:  Scrotal swelling EXAM: SCROTAL ULTRASOUND  DOPPLER ULTRASOUND OF THE TESTICLES TECHNIQUE: Complete ultrasound examination of the testicles, epididymis, and other scrotal structures was performed. Color and spectral Doppler ultrasound were also utilized to evaluate blood flow to the testicles. COMPARISON:  03/18/2013 FINDINGS: Right testicle Measurements: 40 x 30 x 31 mm.  No mass or abnormal vascularity Left testicle Measurements:  47 x 30 x 30 mm.  No mass or abnormal vascularity Right epididymis:  Normal in size and appearance. Left epididymis:  Normal in size and appearance. Hydrocele: Very large bilateral hydrocele with simple fluid character. Varicocele:  None visualized. Pulsed Doppler interrogation of both testes demonstrates normal waveforms bilaterally. IMPRESSION:  Very large bilateral hydrocele. Electronically Signed   By: Tiburcio Pea M.D.   On: 05/27/2021 11:27    Procedures Procedures   Medications Ordered in ED Medications - No data to display  ED Course  I have reviewed the triage vital signs and the nursing notes.  Pertinent labs & imaging results that were available during my care of the patient were reviewed by me and considered in my medical decision making (see chart for details).    MDM Rules/Calculators/A&P                          Initial impression-patient presents with scrotal pain and swelling.  He is alert, does not appear acute chest, vital signs reassuring.  Due to increased swelling and concern for possible epididymitis versus testicular torsion will obtain basic lab work-up, imaging and reassess.  Work-up-CBC is unremarkable, BMP is unremarkable, UA shows moderate leukocytes no red blood cells white blood cells or bacteria present.  Ultrasound reveals very large bilateral hydroceles without other acute abnormalities present  Rule out-I have low suspicion for UTI, [place], kidney stone as patient denies any urinary symptoms, no CVA tenderness, UA is unremarkable for signs of infection.  He does have  leukocytes present, will obtain urine culture for further evaluation.  Low suspicion for STI presentation atypical etiology, states she has occasional penile discharge, but has no urinary symptoms, Will defer antibiotic treatment at this time as he had a negative GC chlamydia few weeks ago and he is in a monogamous relationship.  I have low suspicion for epididymitis and prostatitis presentation today to of etiology.  Low suspicion for testicular torsion as ultrasound is negative for this.  I have low suspicion for inguinal hernia as I cannot palpate for defects on my exam, abdomen nontender on exam, patient still passing gas and having normal bowel movements.  Low suspicion for CHF exacerbation as he has no pedal edema, no history of this, lung sounds are clear bilaterally.  Plan-  Scrotal swelling-unclear etiology, will have him follow-up with urology for further evaluation, given strict return precautions.  Vital signs have remained stable, no indication for hospital admission.  Patient given at home care as well strict return precautions.  Patient verbalized that they understood agreed to said plan.  Final Clinical Impression(s) / ED Diagnoses Final diagnoses:  Other hydrocele    Rx / DC Orders ED Discharge Orders          Ordered    Ambulatory referral to Urology        05/27/21 1207             Carroll Sage, PA-C 05/27/21 1210    Jacalyn Lefevre, MD 06/01/21 [phone]

## 2021-05-28 LAB — URINE CULTURE: Culture: NO GROWTH

## 2021-06-03 ENCOUNTER — Other Ambulatory Visit: Payer: Self-pay

## 2021-06-03 ENCOUNTER — Ambulatory Visit (INDEPENDENT_AMBULATORY_CARE_PROVIDER_SITE_OTHER): Payer: Medicare Other | Admitting: Urology

## 2021-06-03 ENCOUNTER — Encounter: Payer: Self-pay | Admitting: Urology

## 2021-06-03 VITALS — BP 128/79 | HR 86 | Temp 98.5°F | Wt 174.8 lb

## 2021-06-03 DIAGNOSIS — N433 Hydrocele, unspecified: Secondary | ICD-10-CM

## 2021-06-03 DIAGNOSIS — R350 Frequency of micturition: Secondary | ICD-10-CM

## 2021-06-03 DIAGNOSIS — N453 Epididymo-orchitis: Secondary | ICD-10-CM | POA: Diagnosis not present

## 2021-06-03 DIAGNOSIS — R310 Gross hematuria: Secondary | ICD-10-CM | POA: Diagnosis not present

## 2021-06-03 LAB — BLADDER SCAN AMB NON-IMAGING: Scan Result: 0

## 2021-06-03 MED ORDER — DOXYCYCLINE HYCLATE 100 MG PO CAPS: 100.0000 mg | ORAL_CAPSULE | Freq: Two times a day (BID) | ORAL | 0 refills | Status: AC

## 2021-06-03 NOTE — Progress Notes (Signed)
Urological Symptom Review  Patient is experiencing the following symptoms: Frequent urination Hard to postpone urination   Review of Systems  Gastrointestinal (upper)  : Negative for upper GI symptoms  Gastrointestinal (lower) : Negative for lower GI symptoms  Constitutional : Weight loss  Skin: Negative for skin symptoms  Eyes: Negative for eye symptoms  Ear/Nose/Throat : Negative for Ear/Nose/Throat symptoms  Hematologic/Lymphatic: Negative for Hematologic/Lymphatic symptoms  Cardiovascular : Negative for cardiovascular symptoms  Respiratory : Negative for respiratory symptoms  Endocrine: Negative for endocrine symptoms  Musculoskeletal: Negative for musculoskeletal symptoms  Neurological: Negative for neurological symptoms  Psychologic: Negative for psychiatric symptoms

## 2021-06-03 NOTE — Progress Notes (Signed)
Assessment: 1. Hydrocele, unspecified hydrocele type   2. Gross hematuria   3. Urinary frequency   4. Orchitis and epididymitis     Plan: Diagnosis and management of a hydrocele discussed with the patient in detail.  Management with a hydrocelectomy discussed.  Risk and benefits of the procedure reviewed in detail. I discussed that it is unusual for hydroceles to cause significant pain or discomfort.  Given his symptoms, I would like to treat him empirically with a course of antibiotics. Begin doxycycline 100 mg twice daily x10 days. Today I had a discussion with the patient regarding the findings of gross hematuria including the implications and differential diagnoses associated with it.  I also discussed recommendations for further evaluation including the rationale for upper tract imaging and cystoscopy.  I discussed the nature of these procedures including potential risk and complications.  The patient expressed an understanding of these issues. Schedule for CT abdomen and pelvis without contrast and cystoscopy for further evaluation of gross hematuria. He does have a documented shellfish allergy with wheezing   Chief Complaint:  Chief Complaint  Patient presents with   Hydrocele    History of Present Illness:  Larry Hunt is a 50 y.o. year old male who is seen in consultation from Berle Mull, PA-C  for evaluation of hydrocele.  He reports scrotal swelling for at least 8 years.  This is gradually increased in size.  He does not see any change in the size of the scrotum on a daily basis.  He reports constant discomfort in the scrotal area at a 8/10 level.  Scrotal ultrasound from 05/27/2021 showed normal testes bilaterally and large bilateral hydroceles.  He does report lower urinary tract symptoms for at least 1 year.  He has urinary frequency, urgency, and occasional urge incontinence.  No dysuria.  He does report some occasional episodes of gross hematuria if he  attempts to postpone urination.  He reports this is happened recently. AUA score = 23 today.  Urinalysis from 05/27/2021 showed 0-5 RBCs, 0-5 WBCs, no bacteria. Urine culture showed no growth.   Past Medical History:  Past Medical History:  Diagnosis Date   ALS (amyotrophic lateral sclerosis) (HCC)     Past Surgical History:  Past Surgical History:  Procedure Laterality Date   dislocated hip      Allergies:  Allergies  Allergen Reactions   Shellfish Allergy Anaphylaxis    Family History:  History reviewed. No pertinent family history.  Social History:  Social History   Tobacco Use   Smoking status: Every Day    Packs/day: 0.50    Types: Cigarettes   Smokeless tobacco: Never  Substance Use Topics   Alcohol use: No   Drug use: Yes    Types: Marijuana    Comment: 2x weeks    Review of symptoms:  Constitutional:  Negative for unexplained weight loss, night sweats, fever, chills ENT:  Negative for nose bleeds, sinus pain, painful swallowing CV:  Negative for chest pain, shortness of breath, exercise intolerance, palpitations, loss of consciousness Resp:  Negative for cough, wheezing, shortness of breath GI:  Negative for nausea, vomiting, diarrhea, bloody stools GU:  Positives noted in HPI; otherwise negative for dysuria Neuro:  Negative for seizures, poor balance, limb weakness, slurred speech Psych:  Negative for lack of energy, depression, anxiety Endocrine:  Negative for polydipsia, polyuria, symptoms of hypoglycemia (dizziness, hunger, sweating) Hematologic:  Negative for anemia, purpura, petechia, prolonged or excessive bleeding, use of anticoagulants  Allergic:  Negative for difficulty breathing or choking as a result of exposure to anything; no shellfish allergy; no allergic response (rash/itch) to materials, foods  Physical exam: BP 128/79   Pulse 86   Temp 98.5 F (36.9 C)   Wt 174 lb 12.8 oz (79.3 kg)   BMI 21.28 kg/m  GENERAL APPEARANCE:  Well  appearing, well developed, well nourished, NAD HEENT: Atraumatic, Normocephalic, oropharynx clear. NECK: Supple without lymphadenopathy or thyromegaly. LUNGS: Clear to auscultation bilaterally. HEART: Regular Rate and Rhythm without murmurs, gallops, or rubs. ABDOMEN: Soft, non-tender, No Masses. EXTREMITIES: Moves all extremities well.  Without clubbing, cyanosis, or edema. NEUROLOGIC:  Alert and oriented x 3, normal gait, CN II-XII grossly intact.  MENTAL STATUS:  Appropriate. BACK:  Non-tender to palpation.  No CVAT SKIN:  Warm, dry and intact.  GU: Penis:  circumcised Meatus: Normal Scrotum: normal, no masses, significantly enlarged scrotum L>R, consistent with hydrocele, minimally tender, no erythema Testis: right palpably normal, unable to palpate left due to hydrocele    Results: No specimen provided  PVR:  0 ml

## 2021-06-17 ENCOUNTER — Other Ambulatory Visit: Payer: 59 | Admitting: Urology

## 2021-07-06 ENCOUNTER — Other Ambulatory Visit: Payer: Self-pay

## 2021-07-06 ENCOUNTER — Ambulatory Visit (HOSPITAL_COMMUNITY)
Admission: RE | Admit: 2021-07-06 | Discharge: 2021-07-06 | Disposition: A | Discharge status: Home / Self Care | Payer: Medicare Other | Source: Ambulatory Visit | Attending: Urology | Admitting: Urology

## 2021-07-06 DIAGNOSIS — R310 Gross hematuria: Secondary | ICD-10-CM | POA: Insufficient documentation

## 2021-07-07 ENCOUNTER — Ambulatory Visit (INDEPENDENT_AMBULATORY_CARE_PROVIDER_SITE_OTHER): Payer: Medicare Other | Admitting: Urology

## 2021-07-07 ENCOUNTER — Encounter: Payer: Self-pay | Admitting: Urology

## 2021-07-07 VITALS — BP 130/83 | HR 97 | Temp 98.9°F

## 2021-07-07 DIAGNOSIS — N433 Hydrocele, unspecified: Secondary | ICD-10-CM

## 2021-07-07 DIAGNOSIS — R310 Gross hematuria: Secondary | ICD-10-CM | POA: Diagnosis not present

## 2021-07-07 LAB — MICROSCOPIC EXAMINATION
Bacteria, UA: NONE SEEN
Epithelial Cells (non renal): NONE SEEN /hpf (ref 0–10)
RBC, Urine: NONE SEEN /hpf (ref 0–2)
Renal Epithel, UA: NONE SEEN /hpf
WBC, UA: NONE SEEN /hpf (ref 0–5)

## 2021-07-07 LAB — URINALYSIS, ROUTINE W REFLEX MICROSCOPIC
Bilirubin, UA: NEGATIVE
Glucose, UA: NEGATIVE
Ketones, UA: NEGATIVE
Leukocytes,UA: NEGATIVE
Nitrite, UA: NEGATIVE
RBC, UA: NEGATIVE
Specific Gravity, UA: 1.025 (ref 1.005–1.030)
Urobilinogen, Ur: 1 mg/dL (ref 0.2–1.0)
pH, UA: 6.5 (ref 5.0–7.5)

## 2021-07-07 MED ORDER — CIPROFLOXACIN HCL 500 MG PO TABS
500.0000 mg | ORAL_TABLET | Freq: Once | ORAL | Status: AC
  Administered 2021-07-07: 500 mg via ORAL

## 2021-07-07 NOTE — Progress Notes (Signed)
Assessment: 1. Gross hematuria   2. Left hydrocele     Plan: I personally reviewed the CT study from today and agree with the results noted below showing small right renal calculi no evidence of obstruction or renal mass. Normal cystoscopic findings discussed with the patient. Cipro x1 following cystoscopy. I again discussed diagnosis and management options for a hydrocele.  Specifically, I discussed surgical management with a hydrocelectomy.  He would like to proceed with this procedure.  Risk of the procedure including infection, bleeding, injury to scrotal structures, possible recurrence of the hydrocele, and anesthetic risk discussed.  He understands wished to proceed as described.  Procedure: The patient will be scheduled for left hydrocelectomy at [facility].  Surgical request is placed with the surgery schedulers and will be scheduled at the patient's/family request. Informed consent is given as documented below. Anesthesia:  choice  The patient does not have sleep apnea, history of MRSA, history of VRE, history of cardiac device requiring special anesthetic needs. Patient is stable and considered clear for surgical in an outpatient ambulatory surgery setting as well as patient hospital setting.  Consent for Operation or Procedure: Provider Certification I hereby certify that the nature, purpose, benefits, usual and most frequent risks of, and alternatives to, the operation or procedure have been explained to the patient (or person authorized to sign for the patient) either by me as responsible physician or by the provider who is to perform the operation or procedure. Time spent such that the patient/family has had an opportunity to ask questions, and that those questions have been answered. The patient or the patient's representative has been advised that selected tasks may be performed by assistants to the primary health care provider(s). I believe that the patient (or person  authorized to sign for the patient) understands what has been explained, and has consented to the operation or procedure. No guarantees were implied or made.   Chief Complaint:  Chief Complaint  Patient presents with   Hematuria     History of Present Illness:  Larry Hunt is a 50 y.o. year old male who is seen for further evaluation of gross hematuria and a left hydrocele.   He reports scrotal swelling for at least 8 years.  This has gradually increased in size.  He does not see any change in the size of the scrotum on a daily basis.  He reports constant discomfort in the scrotal area at a 8/10 level.  Scrotal ultrasound from 05/27/2021 showed normal testes bilaterally and large bilateral hydroceles.  He reports lower urinary tract symptoms for > 1 year.  He has urinary frequency, urgency, and occasional urge incontinence.  No dysuria.  He reports some occasional episodes of gross hematuria if he attempts to postpone urination.   AUA score = 23.  PVR = 0 ml.  Urinalysis from 05/27/2021 showed 0-5 RBCs, 0-5 WBCs, no bacteria. Urine culture showed no growth.  He was treated with doxycycline for 10 days for possible orchitis/epididymitis. CT imaging from 07/06/2021 showed small right renal calculi, no ureteral calculi or evidence of obstruction and a large left-sided hydrocele. He presents today for further evaluation with cystoscopy.  He has not seen any further gross hematuria.  No significant urinary symptoms today other than some frequency.  He continues to have some symptoms related to the large left hydrocele.  Portions of the above documentation were copied from a prior visit for review purposes only.   Past Medical History:  Past Medical History:  Diagnosis Date   ALS (amyotrophic lateral sclerosis) (HCC)     Past Surgical History:  Past Surgical History:  Procedure Laterality Date   dislocated hip      Allergies:  Allergies  Allergen Reactions   Shellfish  Allergy Anaphylaxis    Family History:  No family history on file.  Social History:  Social History   Tobacco Use   Smoking status: Every Day    Packs/day: 0.50    Types: Cigarettes   Smokeless tobacco: Never  Substance Use Topics   Alcohol use: No   Drug use: Yes    Types: Marijuana    Comment: 2x weeks    ROS: Constitutional:  Negative for fever, chills, weight loss CV: Negative for chest pain, previous MI, hypertension Respiratory:  Negative for shortness of breath, wheezing, sleep apnea, frequent cough GI:  Negative for nausea, vomiting, bloody stool, GERD  Physical exam: BP 130/83   Pulse 97   Temp 98.9 F (37.2 C)  GENERAL APPEARANCE:  Well appearing, well developed, well nourished, NAD HEENT: Atraumatic, Normocephalic, oropharynx clear. NECK: Supple without lymphadenopathy or thyromegaly. LUNGS: Clear to auscultation bilaterally. HEART: Regular Rate and Rhythm without murmurs, gallops, or rubs. ABDOMEN: Soft, non-tender, No Masses. EXTREMITIES: Moves all extremities well.  Without clubbing, cyanosis, or edema. NEUROLOGIC:  Alert and oriented x 3, normal gait, CN II-XII grossly intact.  MENTAL STATUS:  Appropriate. BACK:  Non-tender to palpation.  No CVAT SKIN:  Warm, dry and intact.   GU:  large left hydrocele   Results: U/A dipstick:  +1 protein, negative blood, neg LE  CT ABDOMEN AND PELVIS WITHOUT CONTRAST   TECHNIQUE: Multidetector CT imaging of the abdomen and pelvis was performed following the standard protocol without IV contrast.   COMPARISON:  None.   FINDINGS: Lower chest: No acute findings.   Hepatobiliary: No mass visualized on this unenhanced exam. Gallbladder is unremarkable. No evidence of biliary ductal dilatation.   Pancreas: No mass or inflammatory process visualized on this unenhanced exam.   Spleen:  Within normal limits in size.   Adrenals/Urinary tract: A few tiny 1 mm right renal calculi are noted. No evidence of  ureteral calculi or hydronephrosis.   Stomach/Bowel: No evidence of obstruction, inflammatory process, or abnormal fluid collections. Normal appendix visualized.   Vascular/Lymphatic: No pathologically enlarged lymph nodes identified. No evidence of abdominal aortic aneurysm. Aortic atherosclerotic calcification noted.   Reproductive: Normal size prostate. A large left-sided scrotal hydrocele is noted but incompletely visualized on today's exam.   Other:  None.   Musculoskeletal:  No suspicious bone lesions identified.   IMPRESSION: Tiny right renal calculi. No evidence of ureteral calculi or hydronephrosis.   Large left-sided scrotal hydrocele.     Electronically Signed   By: Danae Orleans M.D.   On: 07/06/2021 16:06  Procedure:  Flexible Cystourethroscopy  Pre-operative Diagnosis: Gross hematuria  Post-operative Diagnosis: Gross hematuria  Anesthesia:  local with lidocaine jelly  Surgical Narrative:  After appropriate informed consent was obtained, the patient was prepped and draped in the usual sterile fashion in the supine position.  He was correctly identified and the proper procedure delineated prior to proceeding.  Sterile lidocaine gel was instilled in the urethra. The flexible cystoscope was introduced without difficulty.  Findings:  Anterior urethra: Normal  Posterior urethra: Normal  Bladder: Normal  Ureteral orifices: normal  Additional findings:  Saline bladder wash for cytology was not performed.    The cystoscope was then removed.  The patient tolerated the  procedure well.

## 2021-07-20 NOTE — Patient Instructions (Signed)
Larry Hunt  07/20/2021     @   Your procedure is scheduled on  07/23/2021.   Report to [facility] at  615  A.M.   Call this number if you have problems the morning of surgery:  [phone]   Remember:  Do not eat or drink after midnight.      Take these medicines the morning of surgery with A SIP OF WATER                                      flexeril, rilutek     Do not wear jewelry, make-up or nail polish.  Do not wear lotions, powders, or perfumes, or deodorant.  Do not shave 48 hours prior to surgery.  Men may shave face and neck.  Do not bring valuables to the hospital.  [facility] is not responsible for any belongings or valuables.  Contacts, dentures or bridgework may not be worn into surgery.  Leave your suitcase in the car.  After surgery it may be brought to your room.  For patients admitted to the hospital, discharge time will be determined by your treatment team.  Patients discharged the day of surgery will not be allowed to drive home and must have someone with them for 24 hours.    Special instructions:   DO NOT smoke tobacco or vape for 24 hours before your procedure.  Please read over the following fact sheets that you were given. Coughing and Deep Breathing, Surgical Site Infection Prevention, Anesthesia Post-op Instructions, and Care and Recovery After Surgery      Hydrocelectomy, Adult, Care After The following information offers guidance on how to care for yourself after your procedure. Your health care provider may also give you more specific instructions. If you have problems or questions, contact your health care provider. What can I expect after the procedure? After your procedure, it is common to have: Mild discomfort and swelling in the pouch that holds your testicles (scrotum). Bruising of the scrotum. Follow these instructions at home: Medicines Take over-the-counter and prescription medicines  only as told by your health care provider. Ask your health care provider if the medicine prescribed to you: Requires you to avoid driving or using machinery. Can cause constipation. You may need to take these actions to prevent or treat constipation: Drink enough fluid to keep your urine pale yellow. Take over-the-counter or prescription medicines. Eat foods that are high in fiber, such as beans, whole grains, and fresh fruits and vegetables. Limit foods that are high in fat and processed sugars, such as fried or sweet foods. Bathing Do not take baths, swim, or use a hot tub until your health care provider approves. Ask your health care provider if you may take showers. You may only be allowed to take sponge baths. If you were told to wear an athletic support strap (scrotal support), keep it dry. Take it off when you shower or bathe. Incision care  Follow instructions from your health care provider about how to take care of your incision. Make sure you: Wash your hands with soap and water for at least 20 seconds before and after you change your bandage (dressing). If soap and water are not available, use hand sanitizer. Change your dressing as told by your health care provider. Leave stitches (sutures), skin glue, or adhesive strips in place. These  skin closures may need to stay in place for 2 weeks or longer. If adhesive strip edges start to loosen and curl up, you may trim the loose edges. Do not remove adhesive strips completely unless your health care provider tells you to do that. Check your incision and scrotum every day for signs of infection. Check for: More redness, swelling, or pain. Fluid or blood. Warmth. Pus or a bad smell. Managing pain and swelling If directed, put ice on the affected area. To do this: Put ice in a plastic bag. Place a towel between your skin and the bag. Leave the ice on for 20 minutes, 2-3 times per day. Remove the ice if your skin turns bright red. This  is very important. If you cannot feel pain, heat, or cold, you have a greater risk of damage to the area.  Activity Do not lift anything that is heavier than 10 lb (4.5 kg) until your health care provider says that it is safe. If you were given a sedative during the procedure, it can affect you for several hours. Do not drive or operate machinery until your health care provider says that it is safe. Ask your health care provider when it is safe to drive. Return to your normal activities as told by your health care provider. Ask your health care provider what activities are safe for you. General instructions Do not use any products that contain nicotine or tobacco. These products include cigarettes, chewing tobacco, and vaping devices, such as e-cigarettes. These can delay healing after surgery. If you need help quitting, ask your health care provider. If you were given a scrotal support, wear it as told by your health care provider. Keep all follow-up visits. This is important. If you had a drain put in during the procedure, you will need to have it removed at a follow-up visit. Contact a health care provider if: Your pain is not controlled with medicine. You have more redness, swelling, or pain around your scrotum. You have fluid or blood coming from your incision. Your incision feels warm to the touch. You have pus or a bad smell coming from your incision. You have a fever. Get help right away if: You develop shaking, chills, and a fever that is higher than 101.48F (38.8C). You have redness or swelling that starts at your scrotum and spreads outward to your whole groin. You develop swelling in your legs. You have difficulty breathing. These symptoms may be an emergency. Get help right away. Call 911. Do not wait to see if the symptoms will go away. Do not drive yourself to the hospital. Summary After a hydrocelectomy, it is common to have mild discomfort, swelling, and bruising. Do not  take baths, swim, or use a hot tub until your health care provider approves. Ask your health care provider if you may take showers. If directed, put ice on the affected area to help with pain and swelling. Do not lift anything that is heavier than 10 lb (4.5 kg) until your health care provider says that it is safe. Return to your normal activities as told by your health care provider. If you were given a scrotal support, keep it dry. Wear the scrotal support as told by your health care provider. This information is not intended to replace advice given to you by your health care provider. Make sure you discuss any questions you have with your health care provider. Document Revised: 03/18/2021 Document Reviewed: 03/18/2021 Elsevier Patient Education  2022 [employer].  General Anesthesia, Adult, Care After This sheet gives you information about how to care for yourself after your procedure. Your health care provider may also give you more specific instructions. If you have problems or questions, contact your health care provider. What can I expect after the procedure? After the procedure, the following side effects are common: Pain or discomfort at the IV site. Nausea. Vomiting. Sore throat. Trouble concentrating. Feeling cold or chills. Feeling weak or tired. Sleepiness and fatigue. Soreness and body aches. These side effects can affect parts of the body that were not involved in surgery. Follow these instructions at home: For the time period you were told by your health care provider:  Rest. Do not participate in activities where you could fall or become injured. Do not drive or use machinery. Do not drink alcohol. Do not take sleeping pills or medicines that cause drowsiness. Do not make important decisions or sign legal documents. Do not take care of children on your own. Eating and drinking Follow any instructions from your health care provider about eating or drinking  restrictions. When you feel hungry, start by eating small amounts of foods that are soft and easy to digest (bland), such as toast. Gradually return to your regular diet. Drink enough fluid to keep your urine pale yellow. If you vomit, rehydrate by drinking water, juice, or clear broth. General instructions If you have sleep apnea, surgery and certain medicines can increase your risk for breathing problems. Follow instructions from your health care provider about wearing your sleep device: Anytime you are sleeping, including during daytime naps. While taking prescription pain medicines, sleeping medicines, or medicines that make you drowsy. Have a responsible adult stay with you for the time you are told. It is important to have someone help care for you until you are awake and alert. Return to your normal activities as told by your health care provider. Ask your health care provider what activities are safe for you. Take over-the-counter and prescription medicines only as told by your health care provider. If you smoke, do not smoke without supervision. Keep all follow-up visits as told by your health care provider. This is important. Contact a health care provider if: You have nausea or vomiting that does not get better with medicine. You cannot eat or drink without vomiting. You have pain that does not get better with medicine. You are unable to pass urine. You develop a skin rash. You have a fever. You have redness around your IV site that gets worse. Get help right away if: You have difficulty breathing. You have chest pain. You have blood in your urine or stool, or you vomit blood. Summary After the procedure, it is common to have a sore throat or nausea. It is also common to feel tired. Have a responsible adult stay with you for the time you are told. It is important to have someone help care for you until you are awake and alert. When you feel hungry, start by eating small amounts  of foods that are soft and easy to digest (bland), such as toast. Gradually return to your regular diet. Drink enough fluid to keep your urine pale yellow. Return to your normal activities as told by your health care provider. Ask your health care provider what activities are safe for you. This information is not intended to replace advice given to you by your health care provider. Make sure you discuss any questions you have with your health care provider. Document Revised: 04/16/2020  Document Reviewed: 11/14/2019 Elsevier Patient Education  2022 [employer] How to Use Chlorhexidine for Bathing Chlorhexidine gluconate (CHG) is a germ-killing (antiseptic) solution that is used to clean the skin. It can get rid of the bacteria that normally live on the skin and can keep them away for about 24 hours. To clean your skin with CHG, you may be given: A CHG solution to use in the shower or as part of a sponge bath. A prepackaged cloth that contains CHG. Cleaning your skin with CHG may help lower the risk for infection: While you are staying in the intensive care unit of the hospital. If you have a vascular access, such as a central line, to provide short-term or long-term access to your veins. If you have a catheter to drain urine from your bladder. If you are on a ventilator. A ventilator is a machine that helps you breathe by moving air in and out of your lungs. After surgery. What are the risks? Risks of using CHG include: A skin reaction. Hearing loss, if CHG gets in your ears and you have a perforated eardrum. Eye injury, if CHG gets in your eyes and is not rinsed out. The CHG product catching fire. Make sure that you avoid smoking and flames after applying CHG to your skin. Do not use CHG: If you have a chlorhexidine allergy or have previously reacted to chlorhexidine. On babies younger than 95 months of age. How to use CHG solution Use CHG only as told by your health care provider, and  follow the instructions on the label. Use the full amount of CHG as directed. Usually, this is one bottle. During a shower Follow these steps when using CHG solution during a shower (unless your health care provider gives you different instructions): Start the shower. Use your normal soap and shampoo to wash your face and hair. Turn off the shower or move out of the shower stream. Pour the CHG onto a clean washcloth. Do not use any type of brush or rough-edged sponge. Starting at your neck, lather your body down to your toes. Make sure you follow these instructions: If you will be having surgery, pay special attention to the part of your body where you will be having surgery. Scrub this area for at least 1 minute. Do not use CHG on your head or face. If the solution gets into your ears or eyes, rinse them well with water. Avoid your genital area. Avoid any areas of skin that have broken skin, cuts, or scrapes. Scrub your back and under your arms. Make sure to wash skin folds. Let the lather sit on your skin for 1-2 minutes or as long as told by your health care provider. Thoroughly rinse your entire body in the shower. Make sure that all body creases and crevices are rinsed well. Dry off with a clean towel. Do not put any substances on your body afterward--such as powder, lotion, or perfume--unless you are told to do so by your health care provider. Only use lotions that are recommended by the manufacturer. Put on clean clothes or pajamas. If it is the night before your surgery, sleep in clean sheets.  During a sponge bath Follow these steps when using CHG solution during a sponge bath (unless your health care provider gives you different instructions): Use your normal soap and shampoo to wash your face and hair. Pour the CHG onto a clean washcloth. Starting at your neck, lather your body down to your toes. Make sure  you follow these instructions: If you will be having surgery, pay special  attention to the part of your body where you will be having surgery. Scrub this area for at least 1 minute. Do not use CHG on your head or face. If the solution gets into your ears or eyes, rinse them well with water. Avoid your genital area. Avoid any areas of skin that have broken skin, cuts, or scrapes. Scrub your back and under your arms. Make sure to wash skin folds. Let the lather sit on your skin for 1-2 minutes or as long as told by your health care provider. Using a different clean, wet washcloth, thoroughly rinse your entire body. Make sure that all body creases and crevices are rinsed well. Dry off with a clean towel. Do not put any substances on your body afterward--such as powder, lotion, or perfume--unless you are told to do so by your health care provider. Only use lotions that are recommended by the manufacturer. Put on clean clothes or pajamas. If it is the night before your surgery, sleep in clean sheets. How to use CHG prepackaged cloths Only use CHG cloths as told by your health care provider, and follow the instructions on the label. Use the CHG cloth on clean, dry skin. Do not use the CHG cloth on your head or face unless your health care provider tells you to. When washing with the CHG cloth: Avoid your genital area. Avoid any areas of skin that have broken skin, cuts, or scrapes. Before surgery Follow these steps when using a CHG cloth to clean before surgery (unless your health care provider gives you different instructions): Using the CHG cloth, vigorously scrub the part of your body where you will be having surgery. Scrub using a back-and-forth motion for 3 minutes. The area on your body should be completely wet with CHG when you are done scrubbing. Do not rinse. Discard the cloth and let the area air-dry. Do not put any substances on the area afterward, such as powder, lotion, or perfume. Put on clean clothes or pajamas. If it is the night before your surgery, sleep  in clean sheets.  For general bathing Follow these steps when using CHG cloths for general bathing (unless your health care provider gives you different instructions). Use a separate CHG cloth for each area of your body. Make sure you wash between any folds of skin and between your fingers and toes. Wash your body in the following order, switching to a new cloth after each step: The front of your neck, shoulders, and chest. Both of your arms, under your arms, and your hands. Your stomach and groin area, avoiding the genitals. Your right leg and foot. Your left leg and foot. The back of your neck, your back, and your buttocks. Do not rinse. Discard the cloth and let the area air-dry. Do not put any substances on your body afterward--such as powder, lotion, or perfume--unless you are told to do so by your health care provider. Only use lotions that are recommended by the manufacturer. Put on clean clothes or pajamas. Contact a health care provider if: Your skin gets irritated after scrubbing. You have questions about using your solution or cloth. You swallow any chlorhexidine. Call your local poison control center ([phone] in the [place] Get help right away if: Your eyes itch badly, or they become very red or swollen. Your skin itches badly and is red or swollen. Your hearing changes. You have trouble seeing. You have swelling or  tingling in your mouth or throat. You have trouble breathing. These symptoms may represent a serious problem that is an emergency. Do not wait to see if the symptoms will go away. Get medical help right away. Call your local emergency services (911 in the [place] Do not drive yourself to the hospital. Summary Chlorhexidine gluconate (CHG) is a germ-killing (antiseptic) solution that is used to clean the skin. Cleaning your skin with CHG may help to lower your risk for infection. You may be given CHG to use for bathing. It may be in a bottle or in a prepackaged  cloth to use on your skin. Carefully follow your health care provider's instructions and the instructions on the product label. Do not use CHG if you have a chlorhexidine allergy. Contact your health care provider if your skin gets irritated after scrubbing. This information is not intended to replace advice given to you by your health care provider. Make sure you discuss any questions you have with your health care provider. Document Revised: 10/12/2020 Document Reviewed: 10/12/2020 Elsevier Patient Education  2022 [employer].

## 2021-07-21 ENCOUNTER — Encounter (HOSPITAL_COMMUNITY)
Admission: RE | Admit: 2021-07-21 | Discharge: 2021-07-21 | Disposition: A | Discharge status: Home / Self Care | Payer: 59 | Source: Ambulatory Visit | Attending: Urology | Admitting: Urology

## 2021-07-21 NOTE — Pre-Procedure Instructions (Signed)
Interoffice message sent to Alfredo Martinez that patient did not show for pre-op.

## 2021-07-26 ENCOUNTER — Ambulatory Visit: Payer: 59 | Admitting: Urology

## 2021-08-12 NOTE — Patient Instructions (Signed)
Larry Hunt  08/12/2021     @   Your procedure is scheduled on  08/18/2021.   Report to [facility] at  0830 A.M.   Call this number if you have problems the morning of surgery:  [phone]   Remember:  Do not eat or drink after midnight.      Take these medicines the morning of surgery with A SIP OF WATER                                      Flexeril, rilutek.     Do not wear jewelry, make-up or nail polish.  Do not wear lotions, powders, or perfumes, or deodorant.  Do not shave 48 hours prior to surgery.  Men may shave face and neck.  Do not bring valuables to the hospital.  [facility] is not responsible for any belongings or valuables.  Contacts, dentures or bridgework may not be worn into surgery.  Leave your suitcase in the car.  After surgery it may be brought to your room.  For patients admitted to the hospital, discharge time will be determined by your treatment team.  Patients discharged the day of surgery will not be allowed to drive home and must have someone with them for 24 hours.    Special instructions:   DO NOT smoke tobacco or vape for 24 hours before your procedure.  Please read over the following fact sheets that you were given. Coughing and Deep Breathing, Surgical Site Infection Prevention, Anesthesia Post-op Instructions, and Care and Recovery After Surgery      Hydrocelectomy, Adult, Care After The following information offers guidance on how to care for yourself after your procedure. Your health care provider may also give you more specific instructions. If you have problems or questions, contact your health care provider. What can I expect after the procedure? After your procedure, it is common to have: Mild discomfort and swelling in the pouch that holds your testicles (scrotum). Bruising of the scrotum. Follow these instructions at home: Medicines Take over-the-counter and prescription medicines  only as told by your health care provider. Ask your health care provider if the medicine prescribed to you: Requires you to avoid driving or using machinery. Can cause constipation. You may need to take these actions to prevent or treat constipation: Drink enough fluid to keep your urine pale yellow. Take over-the-counter or prescription medicines. Eat foods that are high in fiber, such as beans, whole grains, and fresh fruits and vegetables. Limit foods that are high in fat and processed sugars, such as fried or sweet foods. Bathing Do not take baths, swim, or use a hot tub until your health care provider approves. Ask your health care provider if you may take showers. You may only be allowed to take sponge baths. If you were told to wear an athletic support strap (scrotal support), keep it dry. Take it off when you shower or bathe. Incision care  Follow instructions from your health care provider about how to take care of your incision. Make sure you: Wash your hands with soap and water for at least 20 seconds before and after you change your bandage (dressing). If soap and water are not available, use hand sanitizer. Change your dressing as told by your health care provider. Leave stitches (sutures), skin glue, or adhesive strips in place. These skin  closures may need to stay in place for 2 weeks or longer. If adhesive strip edges start to loosen and curl up, you may trim the loose edges. Do not remove adhesive strips completely unless your health care provider tells you to do that. Check your incision and scrotum every day for signs of infection. Check for: More redness, swelling, or pain. Fluid or blood. Warmth. Pus or a bad smell. Managing pain and swelling If directed, put ice on the affected area. To do this: Put ice in a plastic bag. Place a towel between your skin and the bag. Leave the ice on for 20 minutes, 2-3 times per day. Remove the ice if your skin turns bright red. This  is very important. If you cannot feel pain, heat, or cold, you have a greater risk of damage to the area.  Activity Do not lift anything that is heavier than 10 lb (4.5 kg) until your health care provider says that it is safe. If you were given a sedative during the procedure, it can affect you for several hours. Do not drive or operate machinery until your health care provider says that it is safe. Ask your health care provider when it is safe to drive. Return to your normal activities as told by your health care provider. Ask your health care provider what activities are safe for you. General instructions Do not use any products that contain nicotine or tobacco. These products include cigarettes, chewing tobacco, and vaping devices, such as e-cigarettes. These can delay healing after surgery. If you need help quitting, ask your health care provider. If you were given a scrotal support, wear it as told by your health care provider. Keep all follow-up visits. This is important. If you had a drain put in during the procedure, you will need to have it removed at a follow-up visit. Contact a health care provider if: Your pain is not controlled with medicine. You have more redness, swelling, or pain around your scrotum. You have fluid or blood coming from your incision. Your incision feels warm to the touch. You have pus or a bad smell coming from your incision. You have a fever. Get help right away if: You develop shaking, chills, and a fever that is higher than 101.36F (38.8C). You have redness or swelling that starts at your scrotum and spreads outward to your whole groin. You develop swelling in your legs. You have difficulty breathing. These symptoms may be an emergency. Get help right away. Call 911. Do not wait to see if the symptoms will go away. Do not drive yourself to the hospital. Summary After a hydrocelectomy, it is common to have mild discomfort, swelling, and bruising. Do not  take baths, swim, or use a hot tub until your health care provider approves. Ask your health care provider if you may take showers. If directed, put ice on the affected area to help with pain and swelling. Do not lift anything that is heavier than 10 lb (4.5 kg) until your health care provider says that it is safe. Return to your normal activities as told by your health care provider. If you were given a scrotal support, keep it dry. Wear the scrotal support as told by your health care provider. This information is not intended to replace advice given to you by your health care provider. Make sure you discuss any questions you have with your health care provider. Document Revised: 03/18/2021 Document Reviewed: 03/18/2021 Elsevier Patient Education  2022 [employer]. General  Anesthesia, Adult, Care After This sheet gives you information about how to care for yourself after your procedure. Your health care provider may also give you more specific instructions. If you have problems or questions, contact your health care provider. What can I expect after the procedure? After the procedure, the following side effects are common: Pain or discomfort at the IV site. Nausea. Vomiting. Sore throat. Trouble concentrating. Feeling cold or chills. Feeling weak or tired. Sleepiness and fatigue. Soreness and body aches. These side effects can affect parts of the body that were not involved in surgery. Follow these instructions at home: For the time period you were told by your health care provider:  Rest. Do not participate in activities where you could fall or become injured. Do not drive or use machinery. Do not drink alcohol. Do not take sleeping pills or medicines that cause drowsiness. Do not make important decisions or sign legal documents. Do not take care of children on your own. Eating and drinking Follow any instructions from your health care provider about eating or drinking  restrictions. When you feel hungry, start by eating small amounts of foods that are soft and easy to digest (bland), such as toast. Gradually return to your regular diet. Drink enough fluid to keep your urine pale yellow. If you vomit, rehydrate by drinking water, juice, or clear broth. General instructions If you have sleep apnea, surgery and certain medicines can increase your risk for breathing problems. Follow instructions from your health care provider about wearing your sleep device: Anytime you are sleeping, including during daytime naps. While taking prescription pain medicines, sleeping medicines, or medicines that make you drowsy. Have a responsible adult stay with you for the time you are told. It is important to have someone help care for you until you are awake and alert. Return to your normal activities as told by your health care provider. Ask your health care provider what activities are safe for you. Take over-the-counter and prescription medicines only as told by your health care provider. If you smoke, do not smoke without supervision. Keep all follow-up visits as told by your health care provider. This is important. Contact a health care provider if: You have nausea or vomiting that does not get better with medicine. You cannot eat or drink without vomiting. You have pain that does not get better with medicine. You are unable to pass urine. You develop a skin rash. You have a fever. You have redness around your IV site that gets worse. Get help right away if: You have difficulty breathing. You have chest pain. You have blood in your urine or stool, or you vomit blood. Summary After the procedure, it is common to have a sore throat or nausea. It is also common to feel tired. Have a responsible adult stay with you for the time you are told. It is important to have someone help care for you until you are awake and alert. When you feel hungry, start by eating small amounts  of foods that are soft and easy to digest (bland), such as toast. Gradually return to your regular diet. Drink enough fluid to keep your urine pale yellow. Return to your normal activities as told by your health care provider. Ask your health care provider what activities are safe for you. This information is not intended to replace advice given to you by your health care provider. Make sure you discuss any questions you have with your health care provider. Document Revised: 04/16/2020 Document  Reviewed: 11/14/2019 Elsevier Patient Education  2022 [employer] How to Use Chlorhexidine for Bathing Chlorhexidine gluconate (CHG) is a germ-killing (antiseptic) solution that is used to clean the skin. It can get rid of the bacteria that normally live on the skin and can keep them away for about 24 hours. To clean your skin with CHG, you may be given: A CHG solution to use in the shower or as part of a sponge bath. A prepackaged cloth that contains CHG. Cleaning your skin with CHG may help lower the risk for infection: While you are staying in the intensive care unit of the hospital. If you have a vascular access, such as a central line, to provide short-term or long-term access to your veins. If you have a catheter to drain urine from your bladder. If you are on a ventilator. A ventilator is a machine that helps you breathe by moving air in and out of your lungs. After surgery. What are the risks? Risks of using CHG include: A skin reaction. Hearing loss, if CHG gets in your ears and you have a perforated eardrum. Eye injury, if CHG gets in your eyes and is not rinsed out. The CHG product catching fire. Make sure that you avoid smoking and flames after applying CHG to your skin. Do not use CHG: If you have a chlorhexidine allergy or have previously reacted to chlorhexidine. On babies younger than 34 months of age. How to use CHG solution Use CHG only as told by your health care provider, and  follow the instructions on the label. Use the full amount of CHG as directed. Usually, this is one bottle. During a shower Follow these steps when using CHG solution during a shower (unless your health care provider gives you different instructions): Start the shower. Use your normal soap and shampoo to wash your face and hair. Turn off the shower or move out of the shower stream. Pour the CHG onto a clean washcloth. Do not use any type of brush or rough-edged sponge. Starting at your neck, lather your body down to your toes. Make sure you follow these instructions: If you will be having surgery, pay special attention to the part of your body where you will be having surgery. Scrub this area for at least 1 minute. Do not use CHG on your head or face. If the solution gets into your ears or eyes, rinse them well with water. Avoid your genital area. Avoid any areas of skin that have broken skin, cuts, or scrapes. Scrub your back and under your arms. Make sure to wash skin folds. Let the lather sit on your skin for 1-2 minutes or as long as told by your health care provider. Thoroughly rinse your entire body in the shower. Make sure that all body creases and crevices are rinsed well. Dry off with a clean towel. Do not put any substances on your body afterward--such as powder, lotion, or perfume--unless you are told to do so by your health care provider. Only use lotions that are recommended by the manufacturer. Put on clean clothes or pajamas. If it is the night before your surgery, sleep in clean sheets.  During a sponge bath Follow these steps when using CHG solution during a sponge bath (unless your health care provider gives you different instructions): Use your normal soap and shampoo to wash your face and hair. Pour the CHG onto a clean washcloth. Starting at your neck, lather your body down to your toes. Make sure you  follow these instructions: If you will be having surgery, pay special  attention to the part of your body where you will be having surgery. Scrub this area for at least 1 minute. Do not use CHG on your head or face. If the solution gets into your ears or eyes, rinse them well with water. Avoid your genital area. Avoid any areas of skin that have broken skin, cuts, or scrapes. Scrub your back and under your arms. Make sure to wash skin folds. Let the lather sit on your skin for 1-2 minutes or as long as told by your health care provider. Using a different clean, wet washcloth, thoroughly rinse your entire body. Make sure that all body creases and crevices are rinsed well. Dry off with a clean towel. Do not put any substances on your body afterward--such as powder, lotion, or perfume--unless you are told to do so by your health care provider. Only use lotions that are recommended by the manufacturer. Put on clean clothes or pajamas. If it is the night before your surgery, sleep in clean sheets. How to use CHG prepackaged cloths Only use CHG cloths as told by your health care provider, and follow the instructions on the label. Use the CHG cloth on clean, dry skin. Do not use the CHG cloth on your head or face unless your health care provider tells you to. When washing with the CHG cloth: Avoid your genital area. Avoid any areas of skin that have broken skin, cuts, or scrapes. Before surgery Follow these steps when using a CHG cloth to clean before surgery (unless your health care provider gives you different instructions): Using the CHG cloth, vigorously scrub the part of your body where you will be having surgery. Scrub using a back-and-forth motion for 3 minutes. The area on your body should be completely wet with CHG when you are done scrubbing. Do not rinse. Discard the cloth and let the area air-dry. Do not put any substances on the area afterward, such as powder, lotion, or perfume. Put on clean clothes or pajamas. If it is the night before your surgery, sleep  in clean sheets.  For general bathing Follow these steps when using CHG cloths for general bathing (unless your health care provider gives you different instructions). Use a separate CHG cloth for each area of your body. Make sure you wash between any folds of skin and between your fingers and toes. Wash your body in the following order, switching to a new cloth after each step: The front of your neck, shoulders, and chest. Both of your arms, under your arms, and your hands. Your stomach and groin area, avoiding the genitals. Your right leg and foot. Your left leg and foot. The back of your neck, your back, and your buttocks. Do not rinse. Discard the cloth and let the area air-dry. Do not put any substances on your body afterward--such as powder, lotion, or perfume--unless you are told to do so by your health care provider. Only use lotions that are recommended by the manufacturer. Put on clean clothes or pajamas. Contact a health care provider if: Your skin gets irritated after scrubbing. You have questions about using your solution or cloth. You swallow any chlorhexidine. Call your local poison control center ([phone] in the [place] Get help right away if: Your eyes itch badly, or they become very red or swollen. Your skin itches badly and is red or swollen. Your hearing changes. You have trouble seeing. You have swelling or tingling  in your mouth or throat. You have trouble breathing. These symptoms may represent a serious problem that is an emergency. Do not wait to see if the symptoms will go away. Get medical help right away. Call your local emergency services (911 in the [place] Do not drive yourself to the hospital. Summary Chlorhexidine gluconate (CHG) is a germ-killing (antiseptic) solution that is used to clean the skin. Cleaning your skin with CHG may help to lower your risk for infection. You may be given CHG to use for bathing. It may be in a bottle or in a prepackaged  cloth to use on your skin. Carefully follow your health care provider's instructions and the instructions on the product label. Do not use CHG if you have a chlorhexidine allergy. Contact your health care provider if your skin gets irritated after scrubbing. This information is not intended to replace advice given to you by your health care provider. Make sure you discuss any questions you have with your health care provider. Document Revised: 10/12/2020 Document Reviewed: 10/12/2020 Elsevier Patient Education  2022 [employer].

## 2021-08-17 ENCOUNTER — Encounter (HOSPITAL_COMMUNITY): Payer: Self-pay

## 2021-08-17 ENCOUNTER — Encounter (HOSPITAL_COMMUNITY)
Admission: RE | Admit: 2021-08-17 | Discharge: 2021-08-17 | Disposition: A | Discharge status: Home / Self Care | Payer: Medicare Other | Source: Ambulatory Visit | Attending: Urology | Admitting: Urology

## 2021-08-18 ENCOUNTER — Ambulatory Visit (HOSPITAL_COMMUNITY)
Admission: RE | Admit: 2021-08-18 | Discharge: 2021-08-18 | Disposition: A | Discharge status: Home / Self Care | Payer: Medicare Other | Attending: Urology | Admitting: Urology

## 2021-08-18 ENCOUNTER — Encounter (HOSPITAL_COMMUNITY)
Admission: RE | Disposition: A | Discharge status: Home / Self Care | Payer: Self-pay | Source: Home / Self Care | Attending: Urology

## 2021-08-18 ENCOUNTER — Ambulatory Visit (HOSPITAL_COMMUNITY): Payer: Medicare Other | Admitting: Certified Registered"

## 2021-08-18 ENCOUNTER — Encounter (HOSPITAL_COMMUNITY): Payer: Self-pay | Admitting: Urology

## 2021-08-18 DIAGNOSIS — F17210 Nicotine dependence, cigarettes, uncomplicated: Secondary | ICD-10-CM | POA: Insufficient documentation

## 2021-08-18 DIAGNOSIS — N433 Hydrocele, unspecified: Secondary | ICD-10-CM | POA: Insufficient documentation

## 2021-08-18 DIAGNOSIS — G1221 Amyotrophic lateral sclerosis: Secondary | ICD-10-CM | POA: Insufficient documentation

## 2021-08-18 HISTORY — PX: HYDROCELE EXCISION: SHX482

## 2021-08-18 SURGERY — HYDROCELECTOMY
Anesthesia: General | Site: Scrotum | Laterality: Left

## 2021-08-18 MED ORDER — GLYCOPYRROLATE 0.2 MG/ML IJ SOLN
INTRAMUSCULAR | Status: DC | PRN
  Administered 2021-08-18 (×2): .1 mg via INTRAVENOUS

## 2021-08-18 MED ORDER — LIDOCAINE HCL URETHRAL/MUCOSAL 2 % EX GEL
CUTANEOUS | Status: AC
  Filled 2021-08-18: qty 10

## 2021-08-18 MED ORDER — CEPHALEXIN 500 MG PO CAPS: 500.0000 mg | ORAL_CAPSULE | Freq: Three times a day (TID) | ORAL | 0 refills | Status: AC

## 2021-08-18 MED ORDER — ONDANSETRON HCL 4 MG/2ML IJ SOLN: 4.0000 mg | Freq: Once | INTRAMUSCULAR | Status: DC | PRN

## 2021-08-18 MED ORDER — FENTANYL CITRATE PF 50 MCG/ML IJ SOSY: 25.0000 ug | PREFILLED_SYRINGE | INTRAMUSCULAR | Status: DC | PRN

## 2021-08-18 MED ORDER — MIDAZOLAM HCL 5 MG/5ML IJ SOLN
INTRAMUSCULAR | Status: DC | PRN
  Administered 2021-08-18: 2 mg via INTRAVENOUS

## 2021-08-18 MED ORDER — FENTANYL CITRATE (PF) 100 MCG/2ML IJ SOLN
INTRAMUSCULAR | Status: AC
  Filled 2021-08-18: qty 2

## 2021-08-18 MED ORDER — DEXMEDETOMIDINE (PRECEDEX) IN NS 20 MCG/5ML (4 MCG/ML) IV SYRINGE
PREFILLED_SYRINGE | INTRAVENOUS | Status: DC | PRN
  Administered 2021-08-18 (×3): 4 ug via INTRAVENOUS

## 2021-08-18 MED ORDER — LIDOCAINE HCL 1 % IJ SOLN
INTRAMUSCULAR | Status: DC | PRN
  Administered 2021-08-18: 100 mg via INTRADERMAL

## 2021-08-18 MED ORDER — LACTATED RINGERS IV SOLN: INTRAVENOUS | Status: DC

## 2021-08-18 MED ORDER — CEFAZOLIN SODIUM-DEXTROSE 2-4 GM/100ML-% IV SOLN
INTRAVENOUS | Status: AC
  Filled 2021-08-18: qty 100

## 2021-08-18 MED ORDER — ONDANSETRON HCL 4 MG/2ML IJ SOLN
INTRAMUSCULAR | Status: AC
  Filled 2021-08-18: qty 2

## 2021-08-18 MED ORDER — DEXAMETHASONE SODIUM PHOSPHATE 10 MG/ML IJ SOLN
INTRAMUSCULAR | Status: DC | PRN
  Administered 2021-08-18: 6 mg via INTRAVENOUS

## 2021-08-18 MED ORDER — LIDOCAINE HCL (PF) 2 % IJ SOLN
INTRAMUSCULAR | Status: AC
  Filled 2021-08-18: qty 5

## 2021-08-18 MED ORDER — MIDAZOLAM HCL 2 MG/2ML IJ SOLN
INTRAMUSCULAR | Status: AC
  Filled 2021-08-18: qty 2

## 2021-08-18 MED ORDER — CEFAZOLIN SODIUM-DEXTROSE 2-4 GM/100ML-% IV SOLN
2.0000 g | INTRAVENOUS | Status: AC
  Administered 2021-08-18: 2 g via INTRAVENOUS

## 2021-08-18 MED ORDER — BUPIVACAINE HCL (PF) 0.25 % IJ SOLN
INTRAMUSCULAR | Status: AC
  Filled 2021-08-18: qty 30

## 2021-08-18 MED ORDER — ONDANSETRON HCL 4 MG/2ML IJ SOLN
INTRAMUSCULAR | Status: DC | PRN
  Administered 2021-08-18: 4 mg via INTRAVENOUS

## 2021-08-18 MED ORDER — ORAL CARE MOUTH RINSE: 15.0000 mL | Freq: Once | OROMUCOSAL | Status: AC

## 2021-08-18 MED ORDER — FENTANYL CITRATE (PF) 100 MCG/2ML IJ SOLN
INTRAMUSCULAR | Status: DC | PRN
Start: 2021-08-18 — End: 2021-08-18
  Administered 2021-08-18 (×2): 50 ug via INTRAVENOUS

## 2021-08-18 MED ORDER — CHLORHEXIDINE GLUCONATE 0.12 % MT SOLN
15.0000 mL | Freq: Once | OROMUCOSAL | Status: AC
  Administered 2021-08-18: 15 mL via OROMUCOSAL

## 2021-08-18 MED ORDER — HYDROCODONE-ACETAMINOPHEN 5-325 MG PO TABS: 1.0000 | ORAL_TABLET | ORAL | 0 refills | Status: AC | PRN

## 2021-08-18 MED ORDER — DEXMEDETOMIDINE (PRECEDEX) IN NS 20 MCG/5ML (4 MCG/ML) IV SYRINGE
PREFILLED_SYRINGE | INTRAVENOUS | Status: AC
  Filled 2021-08-18: qty 5

## 2021-08-18 MED ORDER — PROPOFOL 10 MG/ML IV BOLUS
INTRAVENOUS | Status: DC | PRN
  Administered 2021-08-18: 170 mg via INTRAVENOUS

## 2021-08-18 MED ORDER — BUPIVACAINE HCL (PF) 0.25 % IJ SOLN
INTRAMUSCULAR | Status: DC | PRN
  Administered 2021-08-18: 10 mL

## 2021-08-18 MED ORDER — DEXAMETHASONE SODIUM PHOSPHATE 10 MG/ML IJ SOLN
INTRAMUSCULAR | Status: AC
  Filled 2021-08-18: qty 1

## 2021-08-18 MED ORDER — KETOROLAC TROMETHAMINE 30 MG/ML IJ SOLN
INTRAMUSCULAR | Status: DC | PRN
  Administered 2021-08-18: 30 mg via INTRAVENOUS

## 2021-08-18 MED ORDER — 0.9 % SODIUM CHLORIDE (POUR BTL) OPTIME
TOPICAL | Status: DC | PRN
  Administered 2021-08-18: 1000 mL

## 2021-08-18 SURGICAL SUPPLY — 32 items
ADH SKN CLS APL DERMABOND .7 (GAUZE/BANDAGES/DRESSINGS) ×1
BLADE SURG 15 STRL LF DISP TIS (BLADE) ×1 IMPLANT
BLADE SURG 15 STRL SS (BLADE) ×2
COVER LIGHT HANDLE STERIS (MISCELLANEOUS) ×4 IMPLANT
DERMABOND ADVANCED (GAUZE/BANDAGES/DRESSINGS) ×1
DERMABOND ADVANCED .7 DNX12 (GAUZE/BANDAGES/DRESSINGS) ×1 IMPLANT
DRAIN PENROSE 0.5X18 (DRAIN) ×1 IMPLANT
ELECT REM PT RETURN 9FT ADLT (ELECTROSURGICAL) ×2
ELECTRODE REM PT RTRN 9FT ADLT (ELECTROSURGICAL) ×1 IMPLANT
GAUZE SPONGE 4X4 12PLY STRL (GAUZE/BANDAGES/DRESSINGS) ×3 IMPLANT
GLOVE SRG 8 PF TXTR STRL LF DI (GLOVE) ×1 IMPLANT
GLOVE SURG POLYISO LF SZ8 (GLOVE) ×2 IMPLANT
GLOVE SURG UNDER POLY LF SZ6.5 (GLOVE) ×2 IMPLANT
GLOVE SURG UNDER POLY LF SZ7 (GLOVE) ×5 IMPLANT
GLOVE SURG UNDER POLY LF SZ8 (GLOVE) ×2
GOWN STRL REUS W/TWL LRG LVL3 (GOWN DISPOSABLE) ×4 IMPLANT
GOWN STRL REUS W/TWL XL LVL3 (GOWN DISPOSABLE) ×2 IMPLANT
KIT TURNOVER KIT A (KITS) ×2 IMPLANT
MANIFOLD NEPTUNE II (INSTRUMENTS) ×2 IMPLANT
NDL HYPO 25X1 1.5 SAFETY (NEEDLE) ×1 IMPLANT
NEEDLE HYPO 25X1 1.5 SAFETY (NEEDLE) ×2 IMPLANT
NS IRRIG 1000ML POUR BTL (IV SOLUTION) ×2 IMPLANT
PACK MINOR (CUSTOM PROCEDURE TRAY) ×2 IMPLANT
PAD ARMBOARD 7.5X6 YLW CONV (MISCELLANEOUS) ×2 IMPLANT
PENCIL SMOKE EVACUATOR (MISCELLANEOUS) ×2 IMPLANT
SET BASIN LINEN APH (SET/KITS/TRAYS/PACK) ×2 IMPLANT
SUPPORT SCROTAL LG STRP (MISCELLANEOUS) ×2 IMPLANT
SUT CHROMIC 3 0 SH 27 (SUTURE) ×2 IMPLANT
SUT VIC AB 3-0 SH 27 (SUTURE) ×4
SUT VIC AB 3-0 SH 27X BRD (SUTURE) ×1 IMPLANT
SYR CONTROL 10ML LL (SYRINGE) ×2 IMPLANT
YANKAUER SUCT 12FT TUBE ARGYLE (SUCTIONS) ×2 IMPLANT

## 2021-08-18 NOTE — H&P (Signed)
Assessment: 1. Gross hematuria   2. Left hydrocele       Plan: I again discussed diagnosis and management options for a hydrocele.  Specifically, I discussed surgical management with a hydrocelectomy.  He would like to proceed with this procedure.  Risk of the procedure including infection, bleeding, injury to scrotal structures, possible recurrence of the hydrocele, and anesthetic risk discussed.  He understands wished to proceed as described.   Procedure: The patient will be scheduled for left hydrocelectomy at [facility].  Surgical request is placed with the surgery schedulers and will be scheduled at the patient's/family request. Informed consent is given as documented below. Anesthesia:  choice   The patient does not have sleep apnea, history of MRSA, history of VRE, history of cardiac device requiring special anesthetic needs. Patient is stable and considered clear for surgical in an outpatient ambulatory surgery setting as well as patient hospital setting.   Consent for Operation or Procedure: Provider Certification I hereby certify that the nature, purpose, benefits, usual and most frequent risks of, and alternatives to, the operation or procedure have been explained to the patient (or person authorized to sign for the patient) either by me as responsible physician or by the provider who is to perform the operation or procedure. Time spent such that the patient/family has had an opportunity to ask questions, and that those questions have been answered. The patient or the patient's representative has been advised that selected tasks may be performed by assistants to the primary health care provider(s). I believe that the patient (or person authorized to sign for the patient) understands what has been explained, and has consented to the operation or procedure. No guarantees were implied or made.     Chief Complaint:     Chief Complaint  Patient presents with   Hematuria         History of Present Illness:   Larry Hunt is a 51 y.o. year old male who is seen for further evaluation of gross hematuria and a left hydrocele.   He reports scrotal swelling for at least 8 years.  This has gradually increased in size.  He does not see any change in the size of the scrotum on a daily basis.  He reports constant discomfort in the scrotal area at a 8/10 level.  Scrotal ultrasound from 05/27/2021 showed normal testes bilaterally and large bilateral hydroceles.  He reports lower urinary tract symptoms for > 1 year.  He has urinary frequency, urgency, and occasional urge incontinence.  No dysuria.  He reports some occasional episodes of gross hematuria if he attempts to postpone urination.   AUA score = 23.  PVR = 0 ml.   Urinalysis from 05/27/2021 showed 0-5 RBCs, 0-5 WBCs, no bacteria. Urine culture showed no growth.   He was treated with doxycycline for 10 days for possible orchitis/epididymitis. CT imaging from 07/06/2021 showed small right renal calculi, no ureteral calculi or evidence of obstruction and a large left-sided hydrocele. He presents today for further evaluation with cystoscopy.  He has not seen any further gross hematuria.  No significant urinary symptoms today other than some frequency.  He continues to have some symptoms related to the large left hydrocele.   Portions of the above documentation were copied from a prior visit for review purposes only.     Past Medical History:      Past Medical History:  Diagnosis Date   ALS (amyotrophic lateral sclerosis) ([place])  Past Surgical History:       Past Surgical History:  Procedure Laterality Date   dislocated hip          Allergies:      Allergies  Allergen Reactions   Shellfish Allergy Anaphylaxis      Family History:  No family history on file.   Social History:  Social History         Tobacco Use   Smoking status: Every Day      Packs/day: 0.50      Types: Cigarettes    Smokeless tobacco: Never  Substance Use Topics   Alcohol use: No   Drug use: Yes      Types: Marijuana      Comment: 2x weeks      ROS: Constitutional:  Negative for fever, chills, weight loss CV: Negative for chest pain, previous MI, hypertension Respiratory:  Negative for shortness of breath, wheezing, sleep apnea, frequent cough GI:  Negative for nausea, vomiting, bloody stool, GERD   Physical exam: BP 130/83    Pulse 97    Temp 98.9 F (37.2 C)  GENERAL APPEARANCE:  Well appearing, well developed, well nourished, NAD HEENT: Atraumatic, Normocephalic, oropharynx clear. NECK: Supple without lymphadenopathy or thyromegaly. LUNGS: Clear to auscultation bilaterally. HEART: Regular Rate and Rhythm without murmurs, gallops, or rubs. ABDOMEN: Soft, non-tender, No Masses. EXTREMITIES: Moves all extremities well.  Without clubbing, cyanosis, or edema. NEUROLOGIC:  Alert and oriented x 3, normal gait, CN II-XII grossly intact.  MENTAL STATUS:  Appropriate. BACK:  Non-tender to palpation.  No CVAT SKIN:  Warm, dry and intact.   GU:  large left hydrocele    Results: U/A dipstick:  +1 protein, negative blood, neg LE   CT ABDOMEN AND PELVIS WITHOUT CONTRAST   TECHNIQUE: Multidetector CT imaging of the abdomen and pelvis was performed following the standard protocol without IV contrast.   COMPARISON:  None.   FINDINGS: Lower chest: No acute findings.   Hepatobiliary: No mass visualized on this unenhanced exam. Gallbladder is unremarkable. No evidence of biliary ductal dilatation.   Pancreas: No mass or inflammatory process visualized on this unenhanced exam.   Spleen:  Within normal limits in size.   Adrenals/Urinary tract: A few tiny 1 mm right renal calculi are noted. No evidence of ureteral calculi or hydronephrosis.   Stomach/Bowel: No evidence of obstruction, inflammatory process, or abnormal fluid collections. Normal appendix visualized.   Vascular/Lymphatic: No  pathologically enlarged lymph nodes identified. No evidence of abdominal aortic aneurysm. Aortic atherosclerotic calcification noted.   Reproductive: Normal size prostate. A large left-sided scrotal hydrocele is noted but incompletely visualized on today's exam.   Other:  None.   Musculoskeletal:  No suspicious bone lesions identified.   IMPRESSION: Tiny right renal calculi. No evidence of ureteral calculi or hydronephrosis.   Large left-sided scrotal hydrocele.     Electronically Signed   By: Marlaine Hind M.D.   On: 07/06/2021 16:06

## 2021-08-18 NOTE — Op Note (Signed)
OPERATIVE NOTE   Patient Name: Larry Hunt   Date of Procedure: 08/18/21   Preoperative diagnosis:  Left hydrocele  Postoperative diagnosis:  Left hydrocele  Procedure:  Left hydrocelectomy  Attending: Primus Bravo, MD  Anesthesia: General with LMA, local anesthesia  Estimated blood loss: 5 mL  Fluids: Per anesthesia record  Drains: Penrose drain in left scrotum  Specimens: Left hydrocele sac  Antibiotics: Ancef 2 g IV  Findings: Large left hydrocele containing 1550 mL of clear fluid; normal left testicle and epididymis  Indications:  51 year old male presents for surgical management of a large left hydrocele.  He reports left scrotal swelling for at least 8 years which is gradually increased in size.  Scrotal ultrasound from 05/27/2021 showed normal testes bilaterally and bilateral hydroceles, left > right.  Examination demonstrates a significantly enlarged left hemiscrotum consistent with a hydrocele.  Management options were discussed in detail.  He presents now for a left hydrocelectomy.  Risk of the procedure including infection, bleeding, injury to scrotal structures, possible recurrence of the hydrocele, and anesthetic risk.  He understands and wishes to proceed as described.  Description of Procedure:  The patient received Ancef 2 g IV preoperatively.  After successful induction of a general anesthetic, the patient was placed on the operating room table in the supine position.  The patient's genitalia was prepped and draped in sterile fashion.  Examination again revealed a large left hydrocele.  No appreciable right hydrocele was noted.  A midline incision was made along the median raphae.  Dartos fascia was incised revealing the hydrocele sac.  The hydrocele sac was carefully dissected away from overlying tissues.  Adhesions between the hydrocele sac and the scrotal wall were noted laterally.  The hydrocele sac was then opened.  1550 mL of clear  straw-colored fluid was obtained.  The left testicle and epididymis were examined and noted to be normal.  A left appendix testis was removed with the cautery.  The excess hydrocele sac was excised using the cautery.  Hemostasis was obtained.  The edges of the hydrocele sac were then oversewn with a running 3-0 Vicryl suture.  The left testicle was placed back into the scrotum.  Hemostasis was obtained with the cautery.  A Penrose drain was placed into the dependent portion of the left scrotum through a separate stab wound.  The drain was secured to the skin with a 3-0 chromic suture.  The dartos was closed with a running 3-0 chromic suture.  10 mL of quarter percent plain Marcaine was injected into the incision.  The skin edges were approximated using a running 3-0 chromic suture.  Dermabond was applied.  Sterile fluffs and a scrotal support were placed.  The specimen was sent to pathology.  The patient was then extubated and taken to the postanesthesia care unit in stable condition.  Complications: None  Condition: Stable, extubated, transferred to PACU  Plan: Discharge home, return in 2 days for drain removal.

## 2021-08-18 NOTE — Anesthesia Preprocedure Evaluation (Signed)
Anesthesia Evaluation  Patient identified by MRN, date of birth, ID band Patient awake    Reviewed: Allergy & Precautions, H&P , NPO status , Patient's Chart, lab work & pertinent test results, reviewed documented beta blocker date and time   Airway Mallampati: I  TM Distance: >3 FB Neck ROM: full    Dental no notable dental hx.    Pulmonary neg pulmonary ROS, Current Smoker and Patient abstained from smoking.,    Pulmonary exam normal breath sounds clear to auscultation       Cardiovascular Exercise Tolerance: Good negative cardio ROS   Rhythm:regular Rate:Normal     Neuro/Psych  Neuromuscular disease (ALS) negative psych ROS   GI/Hepatic negative GI ROS, Neg liver ROS,   Endo/Other  negative endocrine ROS  Renal/GU negative Renal ROS  negative genitourinary   Musculoskeletal   Abdominal   Peds  Hematology negative hematology ROS (+)   Anesthesia Other Findings   Reproductive/Obstetrics negative OB ROS                             Anesthesia Physical Anesthesia Plan  ASA: 3  Anesthesia Plan: General and General LMA   Post-op Pain Management:    Induction:   PONV Risk Score and Plan: Ondansetron  Airway Management Planned:   Additional Equipment:   Intra-op Plan:   Post-operative Plan:   Informed Consent: I have reviewed the patients History and Physical, chart, labs and discussed the procedure including the risks, benefits and alternatives for the proposed anesthesia with the patient or authorized representative who has indicated his/her understanding and acceptance.     Dental Advisory Given  Plan Discussed with: CRNA  Anesthesia Plan Comments:         Anesthesia Quick Evaluation

## 2021-08-18 NOTE — Transfer of Care (Signed)
Immediate Anesthesia Transfer of Care Note  Patient: Larry Hunt  Procedure(s) Performed: HYDROCELECTOMY ADULT (Left: Scrotum)  Patient Location: PACU  Anesthesia Type:General  Level of Consciousness: drowsy  Airway & Oxygen Therapy: Patient Spontanous Breathing and Patient connected to nasal cannula oxygen  Post-op Assessment: Report given to RN and Post -op Vital signs reviewed and stable  Post vital signs: Reviewed and stable  Last Vitals:  Vitals Value Taken Time  BP 108/74 08/18/21 1252  Temp    Pulse 72 08/18/21 1256  Resp 16 08/18/21 1256  SpO2 99 % 08/18/21 1256  Vitals shown include unvalidated device data.  Last Pain:  Vitals:   08/18/21 0854  TempSrc: Oral  PainSc:       Patients Stated Pain Goal: 8 (08/18/21 0844)  Complications: No notable events documented.

## 2021-08-18 NOTE — Anesthesia Procedure Notes (Addendum)
Procedure Name: LMA Insertion Date/Time: 08/18/2021 11:42 AM Performed by: Marny Lowenstein, CRNA Pre-anesthesia Checklist: Patient identified, Emergency Drugs available, Suction available and Patient being monitored Patient Re-evaluated:Patient Re-evaluated prior to induction Oxygen Delivery Method: Circle System Utilized Preoxygenation: Pre-oxygenation with 100% oxygen Induction Type: IV induction Ventilation: Mask ventilation without difficulty LMA: LMA inserted LMA Size: 5.0 Number of attempts: 1 Placement Confirmation: positive ETCO2 and breath sounds checked- equal and bilateral Tube secured with: Tape Dental Injury: Teeth and Oropharynx as per pre-operative assessment  Comments: Loose dentition of bottom teeth; All teeth intact after LMA insertion

## 2021-08-19 ENCOUNTER — Encounter (HOSPITAL_COMMUNITY): Payer: Self-pay | Admitting: Urology

## 2021-08-19 LAB — SURGICAL PATHOLOGY

## 2021-08-19 NOTE — Anesthesia Postprocedure Evaluation (Signed)
Anesthesia Post Note  Patient: Larry Hunt  Procedure(s) Performed: HYDROCELECTOMY ADULT (Left: Scrotum)  Patient location during evaluation: Phase II Anesthesia Type: General Level of consciousness: awake Pain management: pain level controlled Vital Signs Assessment: post-procedure vital signs reviewed and stable Respiratory status: spontaneous breathing and respiratory function stable Cardiovascular status: blood pressure returned to baseline and stable Postop Assessment: no headache and no apparent nausea or vomiting Anesthetic complications: no Comments: Late entry   No notable events documented.   Last Vitals:  Vitals:   08/18/21 1330 08/18/21 1347  BP: 122/86 (!) 135/94  Pulse: 64 62  Resp: 15 15  Temp:  36.6 C  SpO2: 100% 99%    Last Pain:  Vitals:   08/18/21 1347  TempSrc: Oral  PainSc: 7                  Windell Norfolk

## 2021-08-20 ENCOUNTER — Ambulatory Visit (INDEPENDENT_AMBULATORY_CARE_PROVIDER_SITE_OTHER): Payer: Medicare Other | Admitting: Urology

## 2021-08-20 ENCOUNTER — Other Ambulatory Visit: Payer: Self-pay

## 2021-08-20 ENCOUNTER — Encounter: Payer: Self-pay | Admitting: Urology

## 2021-08-20 VITALS — BP 94/57 | HR 77 | Ht 76.0 in | Wt 180.0 lb

## 2021-08-20 DIAGNOSIS — N433 Hydrocele, unspecified: Secondary | ICD-10-CM

## 2021-08-20 DIAGNOSIS — R350 Frequency of micturition: Secondary | ICD-10-CM

## 2021-08-20 NOTE — Progress Notes (Signed)
Urological Symptom Review  Patient is experiencing the following symptoms: Frequent urination Hard to postpone urination Get up at night to urinate Stream starts and stops   Review of Systems  Gastrointestinal (upper)  : Negative for upper GI symptoms  Gastrointestinal (lower) :   Constitutional : Negative for symptoms  Skin: Negative for skin symptoms  Eyes: Blurred vision Double vision  Ear/Nose/Throat : Negative for Ear/Nose/Throat symptoms  Hematologic/Lymphatic: Negative for Hematologic/Lymphatic symptoms  Cardiovascular : Negative for cardiovascular symptoms  Respiratory : Negative for respiratory symptoms  Endocrine: Negative for endocrine symptoms  Musculoskeletal: Joint pain  Neurological: Negative for neurological symptoms  Psychologic: Negative for psychiatric symptoms

## 2021-08-20 NOTE — Progress Notes (Signed)
° °  Assessment: 1. Hydrocele, left     Plan: Penrose drain removed today Complete antibiotics Continue post-op restrictions Return to office in 3-4 weeks  Chief Complaint: Chief Complaint  Patient presents with   Hydrocele    HPI: Larry Hunt is a 51 y.o. male who presents for continued evaluation of a left hydrocele.  He underwent a left hydrocelectomy on 08/18/2021.  He had 1.5 L of fluid in the left scrotum.  Pathology demonstrated a benign hydrocele sac.  He presents today for removal of the Penrose drain. He is doing well following the surgery.  No significant pain.  He is having some minimal drainage from the Penrose.  He continues on his antibiotics.  No fevers or chills.  Portions of the above documentation were copied from a prior visit for review purposes only.  Allergies: Allergies  Allergen Reactions   Shellfish Allergy Anaphylaxis    PMH: Past Medical History:  Diagnosis Date   ALS (amyotrophic lateral sclerosis) ([facility])     PSH: Past Surgical History:  Procedure Laterality Date   dislocated hip     HYDROCELE EXCISION Left 08/18/2021   Procedure: HYDROCELECTOMY ADULT;  Surgeon: Primus Bravo., MD;  Location: [facility];  Service: Urology;  Laterality: Left;    SH: Social History   Tobacco Use   Smoking status: Every Day    Packs/day: 0.50    Types: Cigarettes   Smokeless tobacco: Never  Substance Use Topics   Alcohol use: No   Drug use: Yes    Types: Marijuana    Comment: 2x weeks    ROS: Constitutional:  Negative for fever, chills, weight loss CV: Negative for chest pain, previous MI, hypertension Respiratory:  Negative for shortness of breath, wheezing, sleep apnea, frequent cough GI:  Negative for nausea, vomiting, bloody stool, GERD  PE: BP (!) 94/57 (BP Location: Left Arm)    Pulse 77    Ht 6\' 4"  (1.93 m)    Wt 180 lb (81.6 kg)    BMI 21.91 kg/m  GENERAL APPEARANCE:  Well appearing, well developed, well nourished, NAD HEENT:   Atraumatic, normocephalic, oropharynx clear NECK:  Supple without lymphadenopathy or thyromegaly ABDOMEN:  Soft, non-tender, no masses EXTREMITIES:  Moves all extremities well, without clubbing, cyanosis, or edema NEUROLOGIC:  Alert and oriented x 3, normal gait, CN II-XII grossly intact MENTAL STATUS:  appropriate BACK:  Non-tender to palpation, No CVAT SKIN:  Warm, dry, and intact GU: Scrotum: Some swelling of the left scrotum without erythema; incision intact; Penrose drain in left hemiscrotum with minimal serosanguineous drainage Testis: Right normal; left slightly tender   Results: None

## 2021-09-09 ENCOUNTER — Ambulatory Visit: Payer: Medicare Other | Admitting: Urology

## 2021-09-09 NOTE — Progress Notes (Deleted)
° °  Assessment: 1. Hydrocele, left      Plan: Penrose drain removed today Complete antibiotics Continue post-op restrictions Return to office in 3-4 weeks  Chief Complaint: No chief complaint on file.   HPI: Larry Hunt is a 51 y.o. male who presents for continued evaluation of a left hydrocele.  He underwent a left hydrocelectomy on 08/18/2021.  He had 1.5 L of fluid in the left scrotum.  Pathology demonstrated a benign hydrocele sac.   His Penrose drain was removed on 08/20/2021.   Portions of the above documentation were copied from a prior visit for review purposes only.  Allergies: Allergies  Allergen Reactions   Shellfish Allergy Anaphylaxis    PMH: Past Medical History:  Diagnosis Date   ALS (amyotrophic lateral sclerosis) ([facility])     PSH: Past Surgical History:  Procedure Laterality Date   dislocated hip     HYDROCELE EXCISION Left 08/18/2021   Procedure: HYDROCELECTOMY ADULT;  Surgeon: Primus Bravo., MD;  Location: [facility];  Service: Urology;  Laterality: Left;    SH: Social History   Tobacco Use   Smoking status: Every Day    Packs/day: 0.50    Types: Cigarettes   Smokeless tobacco: Never  Substance Use Topics   Alcohol use: No   Drug use: Yes    Types: Marijuana    Comment: 2x weeks    ROS: Constitutional:  Negative for fever, chills, weight loss CV: Negative for chest pain, previous MI, hypertension Respiratory:  Negative for shortness of breath, wheezing, sleep apnea, frequent cough GI:  Negative for nausea, vomiting, bloody stool, GERD  PE: There were no vitals taken for this visit. ***  Results: None

## 2021-09-16 ENCOUNTER — Ambulatory Visit: Payer: Medicare Other | Admitting: Urology

## 2021-09-16 DIAGNOSIS — R310 Gross hematuria: Secondary | ICD-10-CM

## 2021-09-16 DIAGNOSIS — N433 Hydrocele, unspecified: Secondary | ICD-10-CM

## 2021-09-16 DIAGNOSIS — R350 Frequency of micturition: Secondary | ICD-10-CM

## 2021-09-16 NOTE — Progress Notes (Deleted)
° °  Assessment: 1. Left hydrocele     Plan: Continue post-op restrictions Return to office in 3-4 weeks  Chief Complaint: No chief complaint on file.   HPI: Larry Hunt is a 51 y.o. male who presents for continued evaluation of a left hydrocele.  He underwent a left hydrocelectomy on 08/18/2021.  He had 1.5 L of fluid in the left scrotum.  Pathology demonstrated a benign hydrocele sac.   His Penrose drain was removed on 08/20/2021.   Portions of the above documentation were copied from a prior visit for review purposes only.  Allergies: Allergies  Allergen Reactions   Shellfish Allergy Anaphylaxis    PMH: Past Medical History:  Diagnosis Date   ALS (amyotrophic lateral sclerosis) ([facility])     PSH: Past Surgical History:  Procedure Laterality Date   dislocated hip     HYDROCELE EXCISION Left 08/18/2021   Procedure: HYDROCELECTOMY ADULT;  Surgeon: Milderd Meager., MD;  Location: [facility];  Service: Urology;  Laterality: Left;    SH: Social History   Tobacco Use   Smoking status: Every Day    Packs/day: 0.50    Types: Cigarettes   Smokeless tobacco: Never  Substance Use Topics   Alcohol use: No   Drug use: Yes    Types: Marijuana    Comment: 2x weeks    ROS: Constitutional:  Negative for fever, chills, weight loss CV: Negative for chest pain, previous MI, hypertension Respiratory:  Negative for shortness of breath, wheezing, sleep apnea, frequent cough GI:  Negative for nausea, vomiting, bloody stool, GERD  PE: There were no vitals taken for this visit. ***  Results: None

## 2021-09-20 ENCOUNTER — Ambulatory Visit: Payer: Medicare Other | Admitting: Urology

## 2021-09-20 NOTE — Progress Notes (Deleted)
° °  Assessment: 1. Hydrocele, left      Plan: Continue post-op restrictions Return to office in 3-4 weeks  Chief Complaint: No chief complaint on file.   HPI: Larry Hunt is a 51 y.o. male who presents for continued evaluation of a left hydrocele.  He underwent a left hydrocelectomy on 08/18/2021.  He had 1.5 L of fluid in the left scrotum.  Pathology demonstrated a benign hydrocele sac.   His Penrose drain was removed on 08/20/2021.   Portions of the above documentation were copied from a prior visit for review purposes only.  Allergies: Allergies  Allergen Reactions   Shellfish Allergy Anaphylaxis    PMH: Past Medical History:  Diagnosis Date   ALS (amyotrophic lateral sclerosis) ([facility])     PSH: Past Surgical History:  Procedure Laterality Date   dislocated hip     HYDROCELE EXCISION Left 08/18/2021   Procedure: HYDROCELECTOMY ADULT;  Surgeon: Milderd Meager., MD;  Location: [facility];  Service: Urology;  Laterality: Left;    SH: Social History   Tobacco Use   Smoking status: Every Day    Packs/day: 0.50    Types: Cigarettes   Smokeless tobacco: Never  Substance Use Topics   Alcohol use: No   Drug use: Yes    Types: Marijuana    Comment: 2x weeks    ROS: Constitutional:  Negative for fever, chills, weight loss CV: Negative for chest pain, previous MI, hypertension Respiratory:  Negative for shortness of breath, wheezing, sleep apnea, frequent cough GI:  Negative for nausea, vomiting, bloody stool, GERD  PE: There were no vitals taken for this visit. ***  Results: None

## 2022-05-26 IMAGING — CT CT ABD-PELV W/O CM
2 of 6 series · 15 of 46 positions shown, 19 images · non-contrast
Comparison: None.

CLINICAL DATA: Gross hematuria.

EXAM:
CT ABDOMEN AND PELVIS WITHOUT CONTRAST
TECHNIQUE: Multidetector CT imaging of the abdomen and pelvis was performed
following the standard protocol without IV contrast.

[Series 2: axial st · axial · 0.71mm/px · z∈[+924,+1320]mm · 12 of 89 slices shown, 16 images]
[im 5/89  soft-tissue]
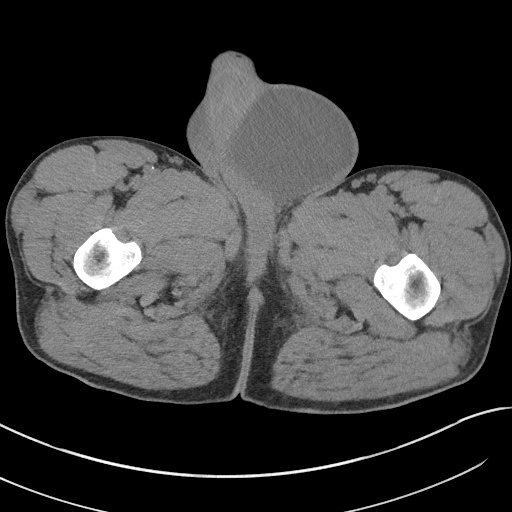
[im 5/89  bone]
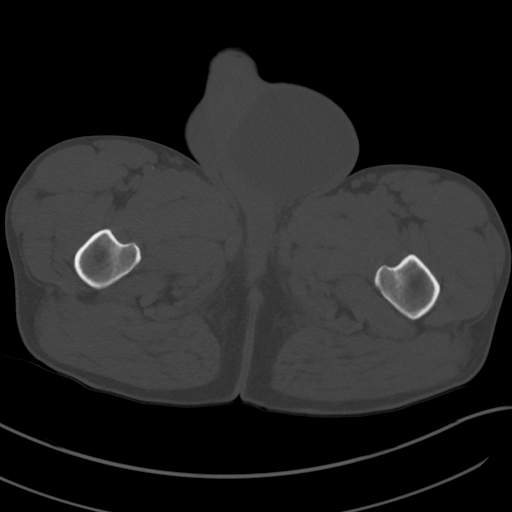
[im 14/89  soft-tissue]
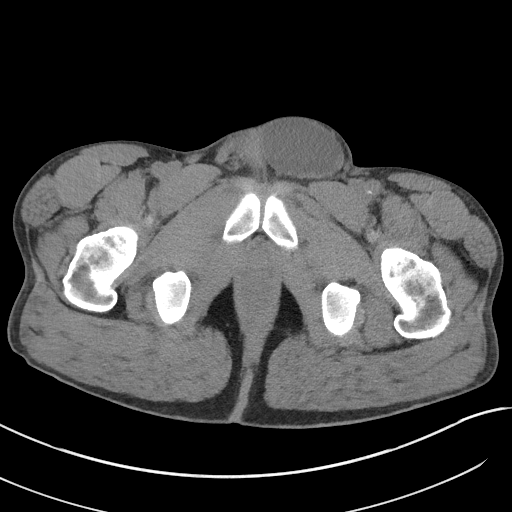
[im 24/89  soft-tissue]
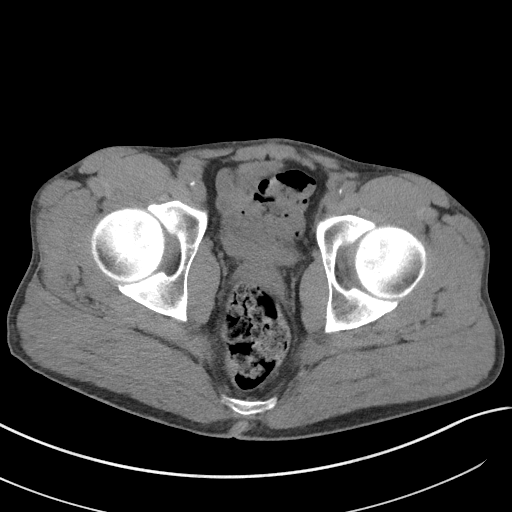
[im 33/89  soft-tissue]
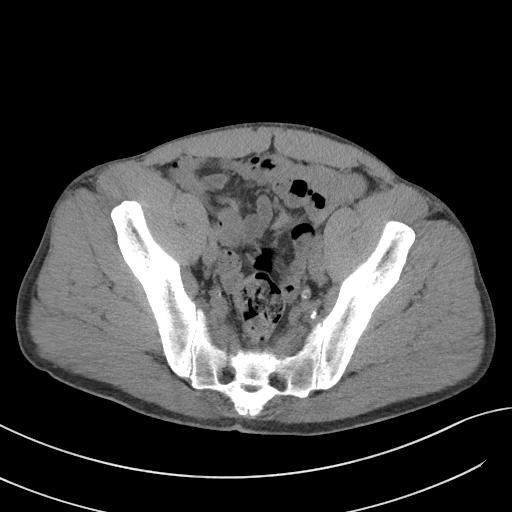
[im 42/89  soft-tissue]
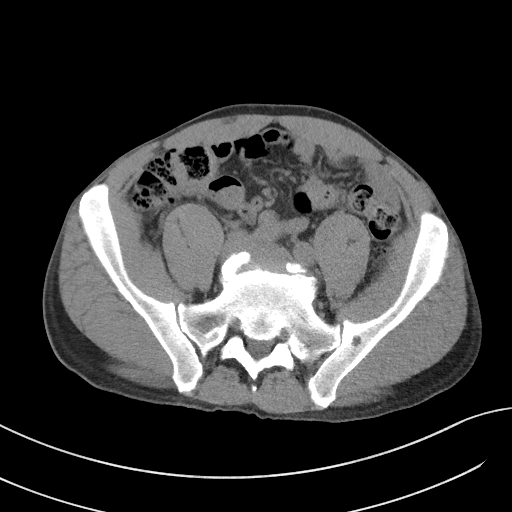
[im 47/89  soft-tissue]
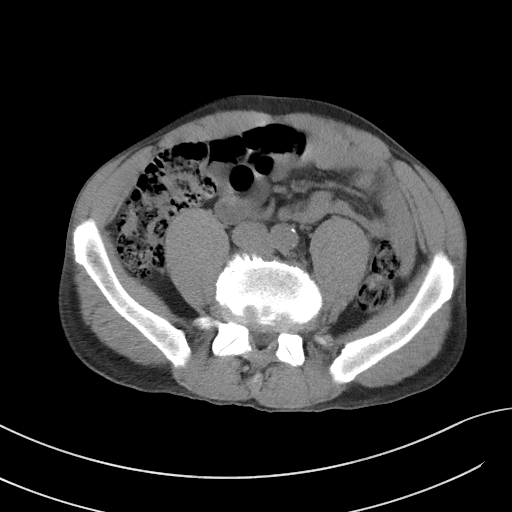
[im 56/89  soft-tissue]
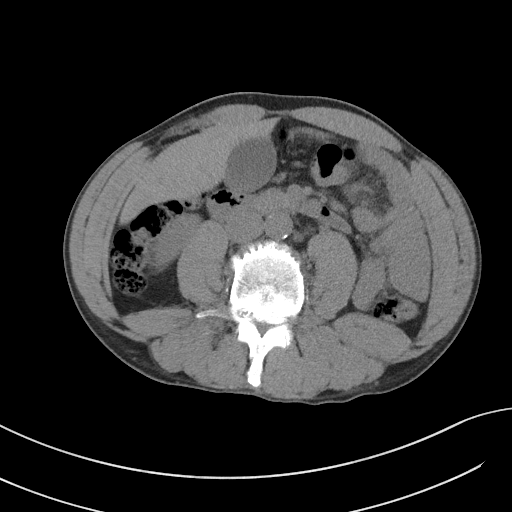
[im 65/89  soft-tissue]
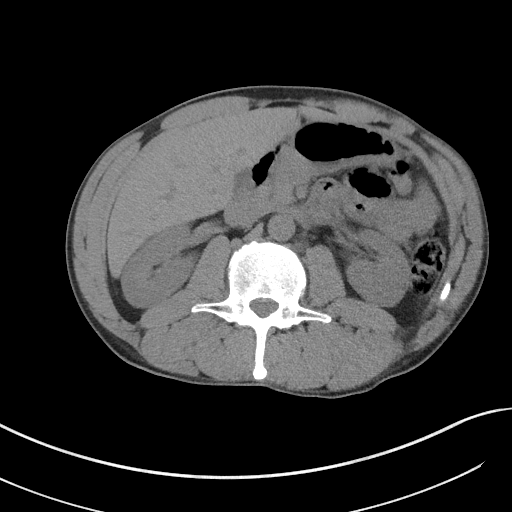
[im 70/89  lung]
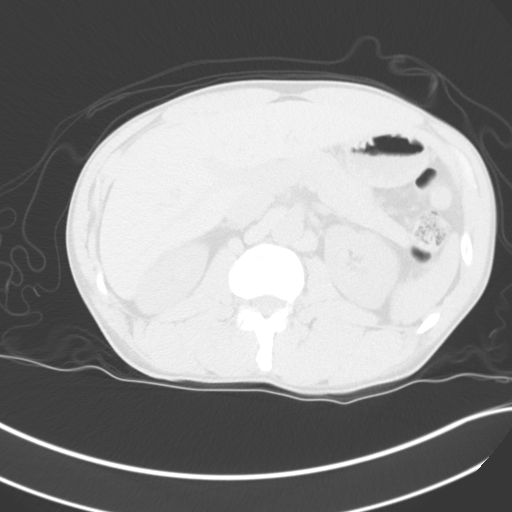
[im 75/89  soft-tissue]
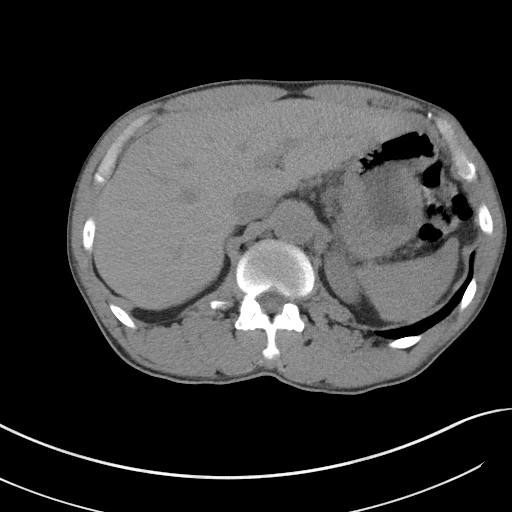
[im 75/89  lung]
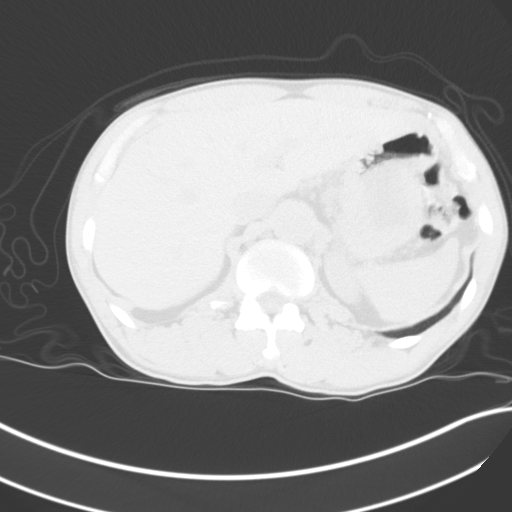
[im 75/89  bone]
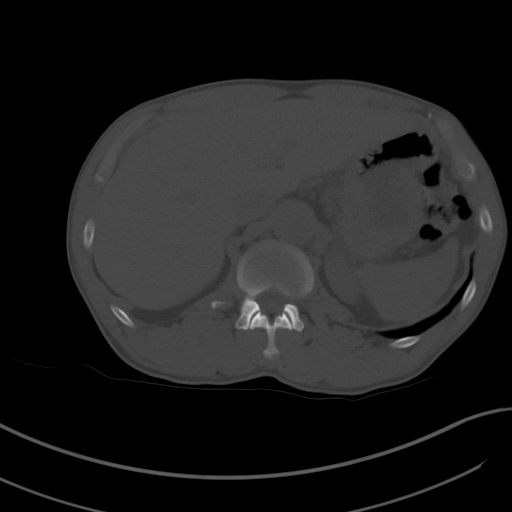
[im 79/89  lung]
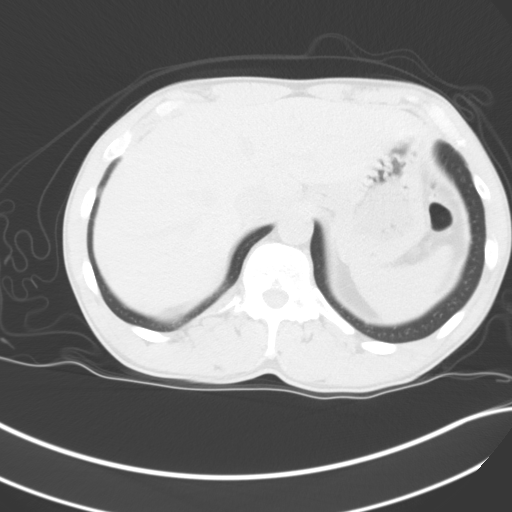
[im 84/89  soft-tissue]
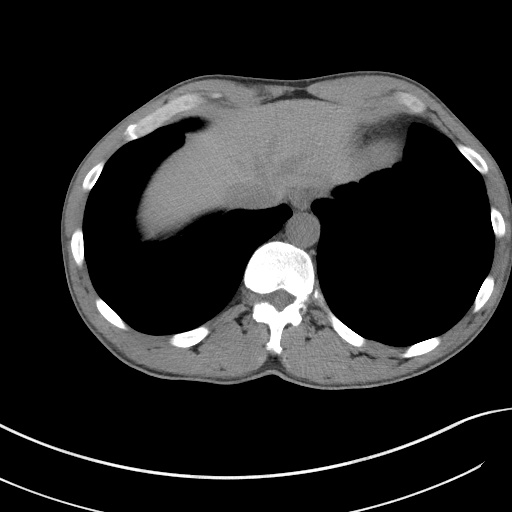
[im 84/89  lung]
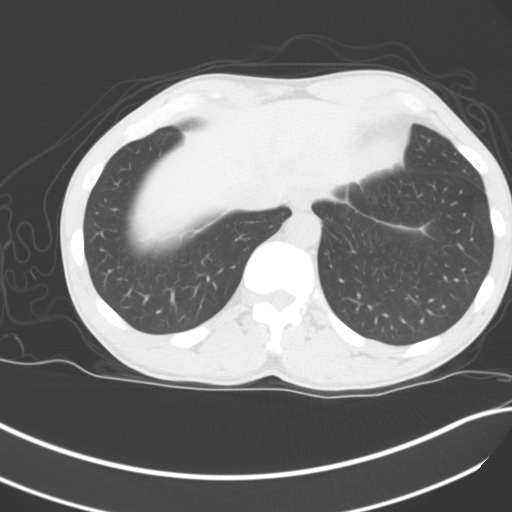

[Series 6: coronal st · coronal · 0.75mm/px · 3 of 101 slices shown]
[im 26/101  soft-tissue]
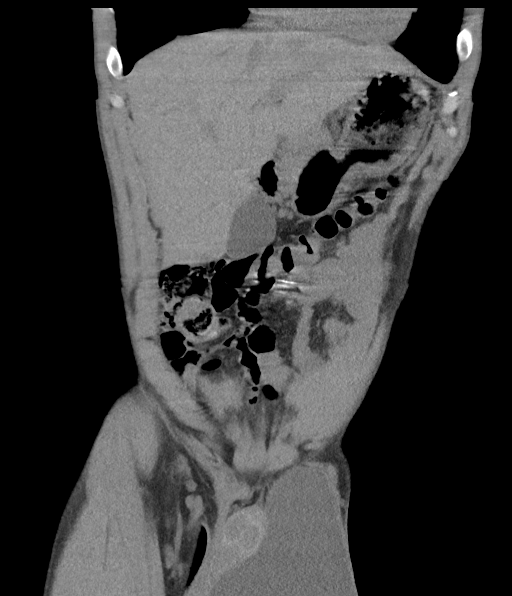
[im 51/101  soft-tissue]
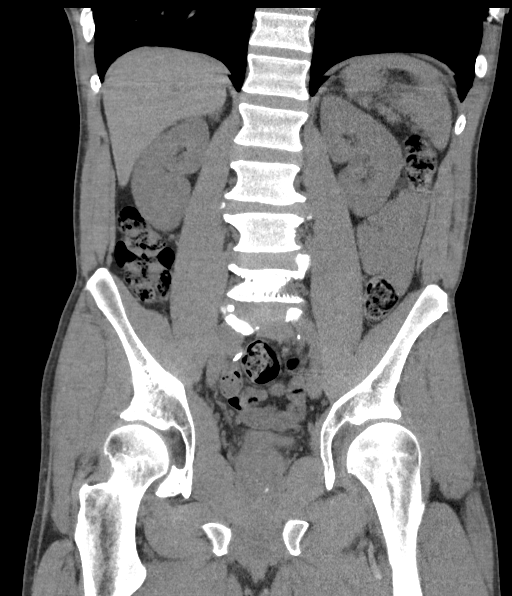
[im 76/101  soft-tissue]
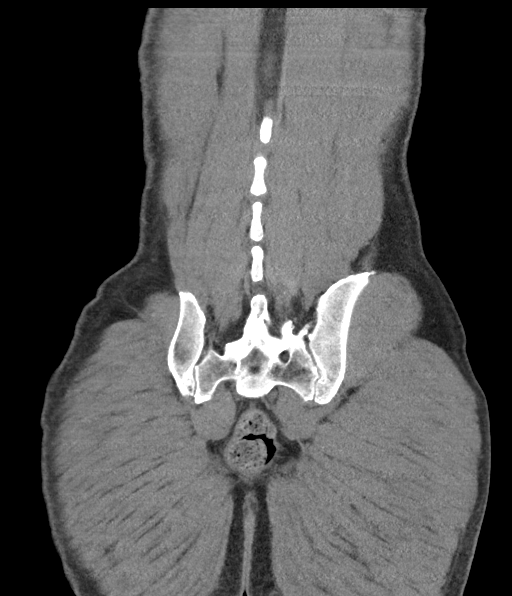

[15 of 46 positions shown; findings below may reference images not displayed]

FINDINGS: Lower chest: No acute findings.

Hepatobiliary: No mass visualized on this unenhanced exam.
Gallbladder is unremarkable. No evidence of biliary ductal
dilatation.

Pancreas: No mass or inflammatory process visualized on this
unenhanced exam.

Spleen:  Within normal limits in size.

Adrenals/Urinary tract: A few tiny 1 mm right renal calculi are
noted. No evidence of ureteral calculi or hydronephrosis.

Stomach/Bowel: No evidence of obstruction, inflammatory process, or
abnormal fluid collections. Normal appendix visualized.

Vascular/Lymphatic: No pathologically enlarged lymph nodes
identified. No evidence of abdominal aortic aneurysm. Aortic
atherosclerotic calcification noted.

Reproductive: Normal size prostate. A large left-sided scrotal
hydrocele is noted but incompletely visualized on today's exam.

Other:  None.

Musculoskeletal:  No suspicious bone lesions identified.
IMPRESSION: Tiny right renal calculi. No evidence of ureteral calculi or
hydronephrosis.

Large left-sided scrotal hydrocele.
